# Patient Record
Sex: Male | Born: 1955 | Race: White | Hispanic: No | Marital: Married | Smoking: Never smoker
Health system: Southern US, Community
[De-identification: ages and names within clinical notes are randomized; demographics above are authoritative.]

## PROBLEM LIST (undated history)

## (undated) DIAGNOSIS — G629 Polyneuropathy, unspecified: Secondary | ICD-10-CM

## (undated) DIAGNOSIS — R931 Abnormal findings on diagnostic imaging of heart and coronary circulation: Secondary | ICD-10-CM

## (undated) DIAGNOSIS — I10 Essential (primary) hypertension: Secondary | ICD-10-CM

## (undated) DIAGNOSIS — N189 Chronic kidney disease, unspecified: Secondary | ICD-10-CM

## (undated) DIAGNOSIS — E1029 Type 1 diabetes mellitus with other diabetic kidney complication: Secondary | ICD-10-CM

## (undated) DIAGNOSIS — E1065 Type 1 diabetes mellitus with hyperglycemia: Secondary | ICD-10-CM

## (undated) DIAGNOSIS — I739 Peripheral vascular disease, unspecified: Secondary | ICD-10-CM

## (undated) DIAGNOSIS — I96 Gangrene, not elsewhere classified: Secondary | ICD-10-CM

## (undated) DIAGNOSIS — N529 Male erectile dysfunction, unspecified: Secondary | ICD-10-CM

## (undated) DIAGNOSIS — R011 Cardiac murmur, unspecified: Secondary | ICD-10-CM

## (undated) DIAGNOSIS — E785 Hyperlipidemia, unspecified: Secondary | ICD-10-CM

## (undated) HISTORY — DX: Type 1 diabetes mellitus with hyperglycemia: E10.65

## (undated) HISTORY — DX: Abnormal findings on diagnostic imaging of heart and coronary circulation: R93.1

## (undated) HISTORY — DX: Cardiac murmur, unspecified: R01.1

## (undated) HISTORY — DX: Essential (primary) hypertension: I10

## (undated) HISTORY — DX: Polyneuropathy, unspecified: G62.9

## (undated) HISTORY — DX: Type 1 diabetes mellitus with other diabetic kidney complication: E10.29

## (undated) HISTORY — DX: Hyperlipidemia, unspecified: E78.5

## (undated) HISTORY — DX: Gangrene, not elsewhere classified: I96

## (undated) HISTORY — DX: Chronic kidney disease, unspecified: N18.9

## (undated) HISTORY — DX: Male erectile dysfunction, unspecified: N52.9

## (undated) HISTORY — DX: Peripheral vascular disease, unspecified: I73.9

---

## 1988-04-02 HISTORY — PX: PENILE PROSTHESIS IMPLANT: SHX240

## 1999-04-03 HISTORY — PX: OTHER SURGICAL HISTORY: SHX169

## 1999-12-11 ENCOUNTER — Encounter: Admission: RE | Admit: 1999-12-11 | Discharge: 2000-03-10 | Payer: Self-pay | Admitting: *Deleted

## 2001-09-09 ENCOUNTER — Ambulatory Visit (HOSPITAL_COMMUNITY): Admission: RE | Admit: 2001-09-09 | Discharge: 2001-09-10 | Payer: Self-pay | Admitting: Ophthalmology

## 2001-09-09 ENCOUNTER — Encounter: Payer: Self-pay | Admitting: Ophthalmology

## 2007-04-03 HISTORY — PX: CATARACT EXTRACTION: SUR2

## 2010-06-22 ENCOUNTER — Other Ambulatory Visit: Payer: Self-pay | Admitting: *Deleted

## 2010-09-21 ENCOUNTER — Encounter (HOSPITAL_COMMUNITY)
Admission: RE | Admit: 2010-09-21 | Discharge: 2010-09-21 | Disposition: A | Discharge status: Home / Self Care | Payer: BC Managed Care – PPO | Source: Ambulatory Visit | Attending: Ophthalmology | Admitting: Ophthalmology

## 2010-09-21 ENCOUNTER — Ambulatory Visit (HOSPITAL_COMMUNITY)
Admission: RE | Admit: 2010-09-21 | Discharge: 2010-09-21 | Disposition: A | Discharge status: Home / Self Care | Payer: BC Managed Care – PPO | Source: Ambulatory Visit | Attending: Ophthalmology | Admitting: Ophthalmology

## 2010-09-21 ENCOUNTER — Other Ambulatory Visit (INDEPENDENT_AMBULATORY_CARE_PROVIDER_SITE_OTHER): Payer: Self-pay | Admitting: Ophthalmology

## 2010-09-21 DIAGNOSIS — H4312 Vitreous hemorrhage, left eye: Secondary | ICD-10-CM

## 2010-09-21 DIAGNOSIS — Z01818 Encounter for other preprocedural examination: Secondary | ICD-10-CM | POA: Insufficient documentation

## 2010-09-21 DIAGNOSIS — Z01812 Encounter for preprocedural laboratory examination: Secondary | ICD-10-CM | POA: Insufficient documentation

## 2010-09-21 DIAGNOSIS — Z01810 Encounter for preprocedural cardiovascular examination: Secondary | ICD-10-CM | POA: Insufficient documentation

## 2010-09-21 DIAGNOSIS — H4310 Vitreous hemorrhage, unspecified eye: Secondary | ICD-10-CM | POA: Insufficient documentation

## 2010-09-21 DIAGNOSIS — I10 Essential (primary) hypertension: Secondary | ICD-10-CM | POA: Insufficient documentation

## 2010-09-21 LAB — CBC
HCT: 41.4 % (ref 39.0–52.0)
Hemoglobin: 14.1 g/dL (ref 13.0–17.0)
MCH: 28.3 pg (ref 26.0–34.0)
MCHC: 34.1 g/dL (ref 30.0–36.0)
MCV: 83.1 fL (ref 78.0–100.0)
Platelets: 238 10*3/uL (ref 150–400)
RBC: 4.98 MIL/uL (ref 4.22–5.81)
RDW: 12.3 % (ref 11.5–15.5)
WBC: 6.8 10*3/uL (ref 4.0–10.5)

## 2010-09-21 LAB — BASIC METABOLIC PANEL
BUN: 36 mg/dL — ABNORMAL HIGH (ref 6–23)
CO2: 26 mEq/L (ref 19–32)
Calcium: 9.5 mg/dL (ref 8.4–10.5)
Chloride: 104 mEq/L (ref 96–112)
Creatinine, Ser: 1.69 mg/dL — ABNORMAL HIGH (ref 0.50–1.35)
GFR calc Af Amer: 51 mL/min — ABNORMAL LOW (ref >60)
GFR calc non Af Amer: 42 mL/min — ABNORMAL LOW (ref >60)
Glucose, Bld: 200 mg/dL — ABNORMAL HIGH (ref 70–99)
Potassium: 5.7 mEq/L — ABNORMAL HIGH (ref 3.5–5.1)
Sodium: 136 mEq/L (ref 135–145)

## 2010-09-21 LAB — SURGICAL PCR SCREEN
MRSA, PCR: NEGATIVE
Staphylococcus aureus: NEGATIVE

## 2010-09-26 ENCOUNTER — Ambulatory Visit (HOSPITAL_COMMUNITY)
Admission: RE | Admit: 2010-09-26 | Discharge: 2010-09-27 | Disposition: A | Discharge status: Home / Self Care | Payer: BC Managed Care – PPO | Source: Ambulatory Visit | Attending: Ophthalmology | Admitting: Ophthalmology

## 2010-09-26 DIAGNOSIS — Z01818 Encounter for other preprocedural examination: Secondary | ICD-10-CM | POA: Insufficient documentation

## 2010-09-26 DIAGNOSIS — Z01810 Encounter for preprocedural cardiovascular examination: Secondary | ICD-10-CM | POA: Insufficient documentation

## 2010-09-26 DIAGNOSIS — E11359 Type 2 diabetes mellitus with proliferative diabetic retinopathy without macular edema: Secondary | ICD-10-CM | POA: Insufficient documentation

## 2010-09-26 DIAGNOSIS — Z01812 Encounter for preprocedural laboratory examination: Secondary | ICD-10-CM | POA: Insufficient documentation

## 2010-09-26 DIAGNOSIS — I10 Essential (primary) hypertension: Secondary | ICD-10-CM | POA: Insufficient documentation

## 2010-09-26 DIAGNOSIS — H4310 Vitreous hemorrhage, unspecified eye: Secondary | ICD-10-CM | POA: Insufficient documentation

## 2010-09-26 DIAGNOSIS — E1139 Type 2 diabetes mellitus with other diabetic ophthalmic complication: Secondary | ICD-10-CM | POA: Insufficient documentation

## 2010-09-26 LAB — GLUCOSE, CAPILLARY
Glucose-Capillary: 123 mg/dL — ABNORMAL HIGH (ref 70–99)
Glucose-Capillary: 106 mg/dL — ABNORMAL HIGH (ref 70–99)
Glucose-Capillary: 113 mg/dL — ABNORMAL HIGH (ref 70–99)

## 2010-09-27 LAB — GLUCOSE, CAPILLARY: Glucose-Capillary: 232 mg/dL — ABNORMAL HIGH (ref 70–99)

## 2010-09-27 LAB — POCT I-STAT, CHEM 8
Creatinine, Ser: 1.7 mg/dL — ABNORMAL HIGH (ref 0.50–1.35)
Glucose, Bld: 183 mg/dL — ABNORMAL HIGH (ref 70–99)
Hemoglobin: 14.3 g/dL (ref 13.0–17.0)
Potassium: 4.7 mEq/L (ref 3.5–5.1)

## 2010-10-20 NOTE — Op Note (Signed)
  Wayne Hartman, Wayne Hartman NO.:  [id number]  MEDICAL RECORD NO.:  [id number]  LOCATION:  79                         FACILITY:  [facility]  PHYSICIAN:  Chrystie Nose. Zigmund Daniel, M.D. DATE OF BIRTH:  1955-06-25  DATE OF PROCEDURE:  09/26/2010 DATE OF DISCHARGE:                              OPERATIVE REPORT   ADMISSION DIAGNOSIS:  Vitreous hemorrhage in posterior vitreous membranes, proliferative diabetic retinopathy, left eye.  PROCEDURES:  Pars plana vitrectomy with 25-gauge system, retinal photocoagulation, membrane peel, gas-fluid exchange in the left eye.  SURGEON:  Chrystie Nose. Zigmund Daniel, MD  ASSISTANT:  Deatra Ina, MA  ANESTHESIA:  General.  DETAILS:  Usual prep and drape, 25 gauge trocar was placed at 10, 2, and 4 o'clock respectively.  Provisc was placed on the corneal surface. Pars plana vitrectomy was begun just behind the cataractous lens.  There was blood on the posterior surface of the lens, this was teased off of the lens with suction.  There was no trauma to the lens.  The vitrectomy was carried into the mid vitreous and into the core vitreous down to the retinal surface where posterior membranes were encountered.  These were removed with a vitreous cutter and the lighted pick.  The vitrectomy is carried out at the mid periphery where blood and vitreous was encountered.  The vitrectomy was then carried to the far periphery down to the vitreous base.  Scleral depression was used to gain access to this area and blood was carefully removed and trimmed from the vitreous base at 360 degrees.  Once this was accomplished, the endolaser was positioned in the eye, 737 burns were placed around the retinal periphery.  The power is 1000 milliwatts each, spot size 1000 microns each, and 0.1 seconds each.  A 30% gas fluid exchange was performed and the instruments were removed from the eye.  The trocars were removed from the eye.  The wounds were tested and found to be  tight.  Hemostasis of the conjunctiva with wet-field cautery was required.  The polymyxin and gentamicin were irrigated into [place] space.  Atropine solution was applied.  Marcaine was injected around the globe for postop pain, Decadron 10 mg was injected into the lower subconjunctival space. Polysporin ophthalmic ointment and patch and shield were placed. Closing pressure was 10 with a Pharmacologist.  COMPLICATIONS:  None.  DURATION:  45 minutes.     Chrystie Nose. Zigmund Daniel, M.D.     JDM/MEDQ  D:  09/26/2010  T:  09/27/2010  Job:  [id number]  Electronically Signed by Tempie Hoist M.D. on 10/20/2010 09:54:31 AM

## 2010-10-30 ENCOUNTER — Encounter (INDEPENDENT_AMBULATORY_CARE_PROVIDER_SITE_OTHER): Payer: BC Managed Care – PPO | Admitting: Ophthalmology

## 2010-10-30 DIAGNOSIS — E1065 Type 1 diabetes mellitus with hyperglycemia: Secondary | ICD-10-CM

## 2010-10-30 DIAGNOSIS — H2510 Age-related nuclear cataract, unspecified eye: Secondary | ICD-10-CM

## 2010-10-30 DIAGNOSIS — H4310 Vitreous hemorrhage, unspecified eye: Secondary | ICD-10-CM

## 2010-10-30 DIAGNOSIS — E1039 Type 1 diabetes mellitus with other diabetic ophthalmic complication: Secondary | ICD-10-CM

## 2010-10-30 DIAGNOSIS — E11359 Type 2 diabetes mellitus with proliferative diabetic retinopathy without macular edema: Secondary | ICD-10-CM

## 2011-01-02 ENCOUNTER — Encounter (INDEPENDENT_AMBULATORY_CARE_PROVIDER_SITE_OTHER): Payer: BC Managed Care – PPO | Admitting: Ophthalmology

## 2011-01-02 DIAGNOSIS — E11359 Type 2 diabetes mellitus with proliferative diabetic retinopathy without macular edema: Secondary | ICD-10-CM

## 2011-01-02 DIAGNOSIS — H2510 Age-related nuclear cataract, unspecified eye: Secondary | ICD-10-CM

## 2011-01-02 DIAGNOSIS — H4310 Vitreous hemorrhage, unspecified eye: Secondary | ICD-10-CM

## 2011-07-09 ENCOUNTER — Ambulatory Visit (INDEPENDENT_AMBULATORY_CARE_PROVIDER_SITE_OTHER): Payer: BC Managed Care – PPO | Admitting: Ophthalmology

## 2011-08-09 ENCOUNTER — Ambulatory Visit (INDEPENDENT_AMBULATORY_CARE_PROVIDER_SITE_OTHER): Payer: BC Managed Care – PPO | Admitting: Ophthalmology

## 2011-08-09 DIAGNOSIS — H2510 Age-related nuclear cataract, unspecified eye: Secondary | ICD-10-CM

## 2011-08-09 DIAGNOSIS — E11359 Type 2 diabetes mellitus with proliferative diabetic retinopathy without macular edema: Secondary | ICD-10-CM

## 2011-08-09 DIAGNOSIS — E1039 Type 1 diabetes mellitus with other diabetic ophthalmic complication: Secondary | ICD-10-CM

## 2012-02-27 ENCOUNTER — Ambulatory Visit (INDEPENDENT_AMBULATORY_CARE_PROVIDER_SITE_OTHER): Payer: BC Managed Care – PPO | Admitting: Ophthalmology

## 2012-03-11 ENCOUNTER — Ambulatory Visit (INDEPENDENT_AMBULATORY_CARE_PROVIDER_SITE_OTHER): Payer: BC Managed Care – PPO | Admitting: Ophthalmology

## 2012-03-11 DIAGNOSIS — H43819 Vitreous degeneration, unspecified eye: Secondary | ICD-10-CM

## 2012-03-11 DIAGNOSIS — E11359 Type 2 diabetes mellitus with proliferative diabetic retinopathy without macular edema: Secondary | ICD-10-CM

## 2012-03-11 DIAGNOSIS — I10 Essential (primary) hypertension: Secondary | ICD-10-CM

## 2012-03-11 DIAGNOSIS — H35039 Hypertensive retinopathy, unspecified eye: Secondary | ICD-10-CM

## 2012-03-11 DIAGNOSIS — E1039 Type 1 diabetes mellitus with other diabetic ophthalmic complication: Secondary | ICD-10-CM

## 2012-03-11 DIAGNOSIS — H2510 Age-related nuclear cataract, unspecified eye: Secondary | ICD-10-CM

## 2012-09-09 ENCOUNTER — Ambulatory Visit (INDEPENDENT_AMBULATORY_CARE_PROVIDER_SITE_OTHER): Payer: BC Managed Care – PPO | Admitting: Ophthalmology

## 2015-07-19 DIAGNOSIS — Z1389 Encounter for screening for other disorder: Secondary | ICD-10-CM | POA: Diagnosis not present

## 2015-07-19 DIAGNOSIS — E1049 Type 1 diabetes mellitus with other diabetic neurological complication: Secondary | ICD-10-CM | POA: Diagnosis not present

## 2015-07-19 DIAGNOSIS — E1065 Type 1 diabetes mellitus with hyperglycemia: Secondary | ICD-10-CM | POA: Diagnosis not present

## 2015-07-19 DIAGNOSIS — I10 Essential (primary) hypertension: Secondary | ICD-10-CM | POA: Diagnosis not present

## 2015-07-28 DIAGNOSIS — E109 Type 1 diabetes mellitus without complications: Secondary | ICD-10-CM | POA: Diagnosis not present

## 2015-08-01 DIAGNOSIS — E109 Type 1 diabetes mellitus without complications: Secondary | ICD-10-CM | POA: Diagnosis not present

## 2015-08-02 DIAGNOSIS — E113511 Type 2 diabetes mellitus with proliferative diabetic retinopathy with macular edema, right eye: Secondary | ICD-10-CM | POA: Diagnosis not present

## 2015-08-02 DIAGNOSIS — H25812 Combined forms of age-related cataract, left eye: Secondary | ICD-10-CM | POA: Diagnosis not present

## 2015-08-25 DIAGNOSIS — E113511 Type 2 diabetes mellitus with proliferative diabetic retinopathy with macular edema, right eye: Secondary | ICD-10-CM | POA: Diagnosis not present

## 2015-09-29 DIAGNOSIS — E113511 Type 2 diabetes mellitus with proliferative diabetic retinopathy with macular edema, right eye: Secondary | ICD-10-CM | POA: Diagnosis not present

## 2015-10-20 DIAGNOSIS — H2512 Age-related nuclear cataract, left eye: Secondary | ICD-10-CM | POA: Diagnosis not present

## 2015-10-20 DIAGNOSIS — H25812 Combined forms of age-related cataract, left eye: Secondary | ICD-10-CM | POA: Diagnosis not present

## 2015-10-21 DIAGNOSIS — H25812 Combined forms of age-related cataract, left eye: Secondary | ICD-10-CM | POA: Diagnosis not present

## 2015-10-24 DIAGNOSIS — E119 Type 2 diabetes mellitus without complications: Secondary | ICD-10-CM | POA: Diagnosis not present

## 2015-10-27 DIAGNOSIS — E113511 Type 2 diabetes mellitus with proliferative diabetic retinopathy with macular edema, right eye: Secondary | ICD-10-CM | POA: Diagnosis not present

## 2015-11-02 DIAGNOSIS — E109 Type 1 diabetes mellitus without complications: Secondary | ICD-10-CM | POA: Diagnosis not present

## 2015-11-02 DIAGNOSIS — E1040 Type 1 diabetes mellitus with diabetic neuropathy, unspecified: Secondary | ICD-10-CM | POA: Diagnosis not present

## 2015-11-02 DIAGNOSIS — E1065 Type 1 diabetes mellitus with hyperglycemia: Secondary | ICD-10-CM | POA: Diagnosis not present

## 2015-11-17 DIAGNOSIS — I10 Essential (primary) hypertension: Secondary | ICD-10-CM | POA: Diagnosis not present

## 2015-11-25 DIAGNOSIS — H40022 Open angle with borderline findings, high risk, left eye: Secondary | ICD-10-CM | POA: Diagnosis not present

## 2016-02-27 DIAGNOSIS — E119 Type 2 diabetes mellitus without complications: Secondary | ICD-10-CM | POA: Diagnosis not present

## 2016-03-01 DIAGNOSIS — E1065 Type 1 diabetes mellitus with hyperglycemia: Secondary | ICD-10-CM | POA: Diagnosis not present

## 2016-03-01 DIAGNOSIS — E1049 Type 1 diabetes mellitus with other diabetic neurological complication: Secondary | ICD-10-CM | POA: Diagnosis not present

## 2016-08-20 DIAGNOSIS — H4051X2 Glaucoma secondary to other eye disorders, right eye, moderate stage: Secondary | ICD-10-CM | POA: Diagnosis not present

## 2016-12-19 DIAGNOSIS — H401132 Primary open-angle glaucoma, bilateral, moderate stage: Secondary | ICD-10-CM | POA: Diagnosis not present

## 2017-01-18 DIAGNOSIS — E1049 Type 1 diabetes mellitus with other diabetic neurological complication: Secondary | ICD-10-CM | POA: Diagnosis not present

## 2017-01-23 DIAGNOSIS — E1065 Type 1 diabetes mellitus with hyperglycemia: Secondary | ICD-10-CM | POA: Diagnosis not present

## 2017-01-23 DIAGNOSIS — E10311 Type 1 diabetes mellitus with unspecified diabetic retinopathy with macular edema: Secondary | ICD-10-CM | POA: Diagnosis not present

## 2017-01-23 DIAGNOSIS — E1049 Type 1 diabetes mellitus with other diabetic neurological complication: Secondary | ICD-10-CM | POA: Diagnosis not present

## 2017-01-23 DIAGNOSIS — E1029 Type 1 diabetes mellitus with other diabetic kidney complication: Secondary | ICD-10-CM | POA: Diagnosis not present

## 2017-03-06 DIAGNOSIS — I10 Essential (primary) hypertension: Secondary | ICD-10-CM | POA: Diagnosis not present

## 2017-03-06 DIAGNOSIS — E10311 Type 1 diabetes mellitus with unspecified diabetic retinopathy with macular edema: Secondary | ICD-10-CM | POA: Diagnosis not present

## 2017-03-06 DIAGNOSIS — Z6827 Body mass index (BMI) 27.0-27.9, adult: Secondary | ICD-10-CM | POA: Diagnosis not present

## 2017-03-06 DIAGNOSIS — E782 Mixed hyperlipidemia: Secondary | ICD-10-CM | POA: Diagnosis not present

## 2017-07-23 DIAGNOSIS — S90934A Unspecified superficial injury of right lesser toe(s), initial encounter: Secondary | ICD-10-CM | POA: Diagnosis not present

## 2017-07-24 DIAGNOSIS — R011 Cardiac murmur, unspecified: Secondary | ICD-10-CM | POA: Diagnosis not present

## 2017-07-24 DIAGNOSIS — M79676 Pain in unspecified toe(s): Secondary | ICD-10-CM | POA: Diagnosis not present

## 2017-07-24 DIAGNOSIS — I96 Gangrene, not elsewhere classified: Secondary | ICD-10-CM | POA: Diagnosis not present

## 2017-07-30 DIAGNOSIS — Z872 Personal history of diseases of the skin and subcutaneous tissue: Secondary | ICD-10-CM | POA: Diagnosis not present

## 2017-07-30 DIAGNOSIS — I96 Gangrene, not elsewhere classified: Secondary | ICD-10-CM | POA: Diagnosis not present

## 2017-08-01 DIAGNOSIS — I739 Peripheral vascular disease, unspecified: Secondary | ICD-10-CM | POA: Diagnosis not present

## 2017-08-01 DIAGNOSIS — R011 Cardiac murmur, unspecified: Secondary | ICD-10-CM | POA: Diagnosis not present

## 2017-08-02 ENCOUNTER — Ambulatory Visit: Payer: BLUE CROSS/BLUE SHIELD | Admitting: Cardiology

## 2017-08-02 VITALS — BP 176/98 | HR 79 | Ht 71.0 in | Wt 205.0 lb

## 2017-08-02 DIAGNOSIS — I739 Peripheral vascular disease, unspecified: Secondary | ICD-10-CM

## 2017-08-02 DIAGNOSIS — I96 Gangrene, not elsewhere classified: Secondary | ICD-10-CM | POA: Insufficient documentation

## 2017-08-02 DIAGNOSIS — G629 Polyneuropathy, unspecified: Secondary | ICD-10-CM

## 2017-08-02 DIAGNOSIS — IMO0002 Reserved for concepts with insufficient information to code with codable children: Secondary | ICD-10-CM

## 2017-08-02 DIAGNOSIS — E785 Hyperlipidemia, unspecified: Secondary | ICD-10-CM | POA: Insufficient documentation

## 2017-08-02 DIAGNOSIS — I10 Essential (primary) hypertension: Secondary | ICD-10-CM

## 2017-08-02 DIAGNOSIS — R931 Abnormal findings on diagnostic imaging of heart and coronary circulation: Secondary | ICD-10-CM

## 2017-08-02 DIAGNOSIS — E1029 Type 1 diabetes mellitus with other diabetic kidney complication: Secondary | ICD-10-CM

## 2017-08-02 DIAGNOSIS — R011 Cardiac murmur, unspecified: Secondary | ICD-10-CM

## 2017-08-02 DIAGNOSIS — N189 Chronic kidney disease, unspecified: Secondary | ICD-10-CM

## 2017-08-02 DIAGNOSIS — E1065 Type 1 diabetes mellitus with hyperglycemia: Secondary | ICD-10-CM

## 2017-08-02 HISTORY — DX: Hyperlipidemia, unspecified: E78.5

## 2017-08-02 HISTORY — DX: Essential (primary) hypertension: I10

## 2017-08-02 HISTORY — DX: Gangrene, not elsewhere classified: I96

## 2017-08-02 HISTORY — DX: Abnormal findings on diagnostic imaging of heart and coronary circulation: R93.1

## 2017-08-02 HISTORY — DX: Chronic kidney disease, unspecified: N18.9

## 2017-08-02 HISTORY — DX: Cardiac murmur, unspecified: R01.1

## 2017-08-02 HISTORY — DX: Type 1 diabetes mellitus with other diabetic kidney complication: E10.29

## 2017-08-02 HISTORY — DX: Reserved for concepts with insufficient information to code with codable children: IMO0002

## 2017-08-02 HISTORY — DX: Peripheral vascular disease, unspecified: I73.9

## 2017-08-02 HISTORY — DX: Polyneuropathy, unspecified: G62.9

## 2017-08-02 NOTE — H&P (View-Only) (Signed)
Cardiology Office Note:    Date:  08/02/2017   ID:  Wayne Hartman, DOB 1955-04-03, MRN [id number]  PCP:  Myer Peer, MD  Cardiologist:  Shirlee More, MD   Referring MD: Myer Peer, MD  ASSESSMENT:    1. Heart murmur   2. Abnormal echocardiogram   3. PAD (peripheral artery disease) ([place])   4. Chronic kidney disease, unspecified CKD stage    PLAN:    In order of problems listed above:  1. He has abnormal echocardiogram transesophageal echo ordered. 2. Echo findings are not diagnostic of endocarditis by itself and although he is received oral antibiotics to be unlikely not to have fever or chills and a normal white count.  He has been off antibiotics for 48 hours we will culture his blood today do a CBC check a sedimentation rate and check a comprehensive metabolic panel with his abnormal renal function.  At this time I prefer to keep him off antibiotics of his echocardiogram is diagnostic he would benefit from infectious disease consult as I suspect that his cultures will be negative because of preceding oral antibiotics. 3. He has mild symptoms of claudication walking uphill I would delay   any further vascular evaluation until the issue of endocarditis has been resolved 4. Recheck renal function today  Next appointment   Medication Adjustments/Labs and Tests Ordered: Current medicines are reviewed at length with the patient today.  Concerns regarding medicines are outlined above.  No orders of the defined types were placed in this encounter.  No orders of the defined types were placed in this encounter.    Chief Complaint  Patient presents with  . Heart Murmur    abnormal echo is concerning for endocarditis    History of Present Illness:    Wayne Hartman is a 62 y.o. male who is being seen today for the evaluation of endocarditis at the request of Myer Peer, MD. Dr. Nicki Reaper phoned me last evening for the patient be worked into our office.  He received a  call that he is an abnormal echocardiogram and  endocarditis.  He has had an embolic episode to a toe and has been seen by surgery and a question of osteomyelitis on x-ray.  He had a new heart murmur appreciated and recently has had oral surgery.  The linical concern is endocarditis,  he has been on oral antibiotics and has not had blood cultures performed.  I reviewed the echocardiogram the report is not definitive and best described as small mobile vegetation suggest clinical correlation and/or TEE and  the degree of valvular regurgitation was mild.  He was seen approximately 2 weeks ago at urgent care because of pain and discoloration of the second toe right foot.  Subsequently is called but there were findings of osteomyelitis and he was started on Augmentin last dose 07/31/2017.  During this time he said no fever or chills no weight loss and does not feel particularly ill.  He says that the toe is improved.  Follow-up with Dr. Nicki Reaper is noted to have a systolic ejection murmur echocardiogram was performed.  He has no underlying history of heart disease.  He did have oral surgery with a tooth extraction and bone graft about 6 weeks ago he had no fever chills afterwards.  He relates that lab work was performed initially that his CBC was good but recent labs noted by surgery yesterday is that he has stage III CKD.  He is having no  neurologic symptoms chest pain shortness of breath or palpitation.  Past Medical History:  Diagnosis Date  . Abnormal echocardiogram 08/02/2017  . CKD (chronic kidney disease) 08/02/2017  . Erectile dysfunction   . Heart murmur 08/02/2017  . Hyperlipidemia 08/02/2017  . Hypertension 08/02/2017  . Necrotic toes ([place]) 08/02/2017  . PAD (peripheral artery disease) ([place]) 08/02/2017  . Peripheral neuropathy 08/02/2017  . Type 1 diabetes, uncontrolled, with renal manifestation ([place]) 08/02/2017    Past Surgical History:  Procedure Laterality Date  . CATARACT EXTRACTION Right 2009   [facility]igmund [facility] at [facility]  . PENILE PROSTHESIS IMPLANT  1990  . vitrous fluid replacement Right 2001    Current Medications: Current Meds  Medication Sig  . insulin glargine (LANTUS) 100 UNIT/ML injection Inject 24 Units into the skin daily.     Allergies:   Pramlintide   Social History   Socioeconomic History  . Marital status: Married    Spouse name: Not on file  . Number of children: Not on file  . Years of education: Not on file  . Highest education level: Not on file  Occupational History  . Not on file  Social Needs  . Financial resource strain: Not on file  . Food insecurity:    Worry: Not on file    Inability: Not on file  . Transportation needs:    Medical: Not on file    Non-medical: Not on file  Tobacco Use  . Smoking status: Never Smoker  . Smokeless tobacco: Never Used  Substance and Sexual Activity  . Alcohol use: Not Currently  . Drug use: Not Currently  . Sexual activity: Not on file  Lifestyle  . Physical activity:    Days per week: Not on file    Minutes per session: Not on file  . Stress: Not on file  Relationships  . Social connections:    Talks on phone: Not on file    Gets together: Not on file    Attends religious service: Not on file    Active member of club or organization: Not on file    Attends meetings of clubs or organizations: Not on file    Relationship status: Not on file  Other Topics Concern  . Not on file  Social History Narrative  . Not on file     Family History: The patient's family history includes Celiac disease in his mother and sister; Diabetes in his mother and sister; Other in his father.  ROS:   ROS Please see the history of present illness.     All other systems reviewed and are negative.  EKGs/Labs/Other Studies Reviewed:    The following studies were reviewed today:   EKG:  EKG is  ordered today.  The ekg ordered today demonstrates normal  ABI's non calculable with non compressive vessels  TTE  cannot exclude vegeatation mitral valve, trace to mild MR  Xray of foot " suspicious for osteomyelitis"  Recent Labs: No results found for requested labs within last 8760 hours.  Recent Lipid Panel No results found for: CHOL, TRIG, HDL, CHOLHDL, VLDL, LDLCALC, LDLDIRECT  Physical Exam:    VS:  BP (!) 176/98 (BP Location: Left Arm, Patient Position: Sitting, Cuff Size: Normal)   Ht 5\' 11"  (1.803 m)   Wt 205 lb (93 kg)   BMI 28.59 kg/m     Wt Readings from Last 3 Encounters:  08/02/17 205 lb (93 kg)     GEN: He does not appear ill  he has no skin or nail findings of endocarditis well nourished, well developed in no acute distress HEENT: Normal NECK: No JVD; No carotid bruits LYMPHATICS: No lymphadenopathy CARDIAC: Grade 1/6 localized ejection murmur aortic area no aortic regurgitation he also has systolic ejection clicks at the left lower sternal border RRR, no, rubs, gallops RESPIRATORY:  Clear to auscultation without rales, wheezing or rhonchi  ABDOMEN: Soft, non-tender, non-distended MUSCULOSKELETAL:  No edema; No deformity  SKIN: Warm and dry NEUROLOGIC:  Alert and oriented x 3 PSYCHIATRIC:  Normal affect     Signed, Shirlee More, MD  08/02/2017 11:46 AM    [place]

## 2017-08-02 NOTE — Patient Instructions (Signed)
Medication Instructions:  None  Labwork: Your physician recommends that you have the following labs drawn: Blood culture, CBC, CMP, sedimentation rate  Testing/Procedures: You had an EKG today.  Your physician has requested that you have a TEE. During a TEE, sound waves are used to create images of your heart. It provides your doctor with information about the size and shape of your heart and how well your heart's chambers and valves are working. In this test, a transducer is attached to the end of a flexible tube that's guided down your throat and into your esophagus (the tube leading from you mouth to your stomach) to get a more detailed image of your heart. You are not awake for the procedure. Please see the instruction sheet given to you today. For further information please visit [email or web address].  Follow-Up: Pending TEE results.  Any Other Special Instructions Will Be Listed Below (If Applicable).     If you need a refill on your cardiac medications before your next appointment, please call your pharmacy.

## 2017-08-02 NOTE — Progress Notes (Signed)
Cardiology Office Note:    Date:  08/02/2017   ID:  Wayne Hartman, DOB 1955/09/14, MRN [id number]  PCP:  Wayne Peer, MD  Cardiologist:  Wayne More, MD   Referring MD: Wayne Peer, MD  ASSESSMENT:    1. Heart murmur   2. Abnormal echocardiogram   3. PAD (peripheral artery disease) ([place])   4. Chronic kidney disease, unspecified CKD stage    PLAN:    In order of problems listed above:  1. He has abnormal echocardiogram transesophageal echo ordered. 2. Echo findings are not diagnostic of endocarditis by itself and although he is received oral antibiotics to be unlikely not to have fever or chills and a normal white count.  He has been off antibiotics for 48 hours we will culture his blood today do a CBC check a sedimentation rate and check a comprehensive metabolic panel with his abnormal renal function.  At this time I prefer to keep him off antibiotics of his echocardiogram is diagnostic he would benefit from infectious disease consult as I suspect that his cultures will be negative because of preceding oral antibiotics. 3. He has mild symptoms of claudication walking uphill I would delay   any further vascular evaluation until the issue of endocarditis has been resolved 4. Recheck renal function today  Next appointment   Medication Adjustments/Labs and Tests Ordered: Current medicines are reviewed at length with the patient today.  Concerns regarding medicines are outlined above.  No orders of the defined types were placed in this encounter.  No orders of the defined types were placed in this encounter.    Chief Complaint  Patient presents with  . Heart Murmur    abnormal echo is concerning for endocarditis    History of Present Illness:    Wayne Hartman is a 62 y.o. male who is being seen today for the evaluation of endocarditis at the request of Wayne Peer, MD. Dr. Nicki Hartman phoned me last evening for the patient be worked into our office.  He received a  call that he is an abnormal echocardiogram and  endocarditis.  He has had an embolic episode to a toe and has been seen by surgery and a question of osteomyelitis on x-ray.  He had a new heart murmur appreciated and recently has had oral surgery.  The linical concern is endocarditis,  he has been on oral antibiotics and has not had blood cultures performed.  I reviewed the echocardiogram the report is not definitive and best described as small mobile vegetation suggest clinical correlation and/or TEE and  the degree of valvular regurgitation was mild.  He was seen approximately 2 weeks ago at urgent care because of pain and discoloration of the second toe right foot.  Subsequently is called but there were findings of osteomyelitis and he was started on Augmentin last dose 07/31/2017.  During this time he said no fever or chills no weight loss and does not feel particularly ill.  He says that the toe is improved.  Follow-up with Dr. Nicki Hartman is noted to have a systolic ejection murmur echocardiogram was performed.  He has no underlying history of heart disease.  He did have oral surgery with a tooth extraction and bone graft about 6 weeks ago he had no fever chills afterwards.  He relates that lab work was performed initially that his CBC was good but recent labs noted by surgery yesterday is that he has stage III CKD.  He is having no  neurologic symptoms chest pain shortness of breath or palpitation.  Past Medical History:  Diagnosis Date  . Abnormal echocardiogram 08/02/2017  . CKD (chronic kidney disease) 08/02/2017  . Erectile dysfunction   . Heart murmur 08/02/2017  . Hyperlipidemia 08/02/2017  . Hypertension 08/02/2017  . Necrotic toes ([place]) 08/02/2017  . PAD (peripheral artery disease) ([place]) 08/02/2017  . Peripheral neuropathy 08/02/2017  . Type 1 diabetes, uncontrolled, with renal manifestation ([place]) 08/02/2017    Past Surgical History:  Procedure Laterality Date  . CATARACT EXTRACTION Right 2009   [facility]und [facility]man at [facility]  . PENILE PROSTHESIS IMPLANT  1990  . vitrous fluid replacement Right 2001    Current Medications: Current Meds  Medication Sig  . insulin glargine (LANTUS) 100 UNIT/ML injection Inject 24 Units into the skin daily.     Allergies:   Pramlintide   Social History   Socioeconomic History  . Marital status: Married    Spouse name: Not on file  . Number of children: Not on file  . Years of education: Not on file  . Highest education level: Not on file  Occupational History  . Not on file  Social Needs  . Financial resource strain: Not on file  . Food insecurity:    Worry: Not on file    Inability: Not on file  . Transportation needs:    Medical: Not on file    Non-medical: Not on file  Tobacco Use  . Smoking status: Never Smoker  . Smokeless tobacco: Never Used  Substance and Sexual Activity  . Alcohol use: Not Currently  . Drug use: Not Currently  . Sexual activity: Not on file  Lifestyle  . Physical activity:    Days per week: Not on file    Minutes per session: Not on file  . Stress: Not on file  Relationships  . Social connections:    Talks on phone: Not on file    Gets together: Not on file    Attends religious service: Not on file    Active member of club or organization: Not on file    Attends meetings of clubs or organizations: Not on file    Relationship status: Not on file  Other Topics Concern  . Not on file  Social History Narrative  . Not on file     Family History: The patient's family history includes Celiac disease in his mother and sister; Diabetes in his mother and sister; Other in his father.  ROS:   ROS Please see the history of present illness.     All other systems reviewed and are negative.  EKGs/Labs/Other Studies Reviewed:    The following studies were reviewed today:   EKG:  EKG is  ordered today.  The ekg ordered today demonstrates normal  ABI's non calculable with non compressive vessels  TTE  cannot exclude vegeatation mitral valve, trace to mild MR  Xray of foot " suspicious for osteomyelitis"  Recent Labs: No results found for requested labs within last 8760 hours.  Recent Lipid Panel No results found for: CHOL, TRIG, HDL, CHOLHDL, VLDL, LDLCALC, LDLDIRECT  Physical Exam:    VS:  BP (!) 176/98 (BP Location: Left Arm, Patient Position: Sitting, Cuff Size: Normal)   Ht 5\' 11"  (1.803 m)   Wt 205 lb (93 kg)   BMI 28.59 kg/m     Wt Readings from Last 3 Encounters:  08/02/17 205 lb (93 kg)     GEN: He does not appear ill  he has no skin or nail findings of endocarditis well nourished, well developed in no acute distress HEENT: Normal NECK: No JVD; No carotid bruits LYMPHATICS: No lymphadenopathy CARDIAC: Grade 1/6 localized ejection murmur aortic area no aortic regurgitation he also has systolic ejection clicks at the left lower sternal border RRR, no, rubs, gallops RESPIRATORY:  Clear to auscultation without rales, wheezing or rhonchi  ABDOMEN: Soft, non-tender, non-distended MUSCULOSKELETAL:  No edema; No deformity  SKIN: Warm and dry NEUROLOGIC:  Alert and oriented x 3 PSYCHIATRIC:  Normal affect     Signed, Wayne More, MD  08/02/2017 11:46 AM    [place]

## 2017-08-03 LAB — COMPREHENSIVE METABOLIC PANEL
ALT: 21 IU/L (ref 0–44)
AST: 25 IU/L (ref 0–40)
Albumin/Globulin Ratio: 2 (ref 1.2–2.2)
Albumin: 4.4 g/dL (ref 3.6–4.8)
Alkaline Phosphatase: 79 IU/L (ref 39–117)
BUN/Creatinine Ratio: 19 (ref 10–24)
BUN: 39 mg/dL — AB (ref 8–27)
Bilirubin Total: 0.4 mg/dL (ref 0.0–1.2)
CALCIUM: 9.4 mg/dL (ref 8.6–10.2)
CO2: 21 mmol/L (ref 20–29)
CREATININE: 2.08 mg/dL — AB (ref 0.76–1.27)
Chloride: 104 mmol/L (ref 96–106)
GFR calc Af Amer: 38 mL/min/{1.73_m2} — ABNORMAL LOW (ref >59)
GFR, EST NON AFRICAN AMERICAN: 33 mL/min/{1.73_m2} — AB (ref >59)
GLOBULIN, TOTAL: 2.2 g/dL (ref 1.5–4.5)
Glucose: 159 mg/dL — ABNORMAL HIGH (ref 65–99)
Potassium: 5.3 mmol/L — ABNORMAL HIGH (ref 3.5–5.2)
Sodium: 140 mmol/L (ref 134–144)
Total Protein: 6.6 g/dL (ref 6.0–8.5)

## 2017-08-03 LAB — CBC
Hematocrit: 45.2 % (ref 37.5–51.0)
Hemoglobin: 14.9 g/dL (ref 13.0–17.7)
MCH: 28.7 pg (ref 26.6–33.0)
MCHC: 33 g/dL (ref 31.5–35.7)
MCV: 87 fL (ref 79–97)
Platelets: 242 10*3/uL (ref 150–379)
RBC: 5.2 x10E6/uL (ref 4.14–5.80)
RDW: 13.4 % (ref 12.3–15.4)
WBC: 6.1 10*3/uL (ref 3.4–10.8)

## 2017-08-03 LAB — SEDIMENTATION RATE: Sed Rate: 4 mm/hr (ref 0–30)

## 2017-08-06 ENCOUNTER — Encounter (HOSPITAL_COMMUNITY): Payer: Self-pay

## 2017-08-06 ENCOUNTER — Ambulatory Visit (HOSPITAL_BASED_OUTPATIENT_CLINIC_OR_DEPARTMENT_OTHER): Payer: BLUE CROSS/BLUE SHIELD

## 2017-08-06 ENCOUNTER — Encounter (HOSPITAL_COMMUNITY)
Admission: RE | Disposition: A | Discharge status: Home / Self Care | Payer: Self-pay | Source: Ambulatory Visit | Attending: Cardiovascular Disease

## 2017-08-06 ENCOUNTER — Other Ambulatory Visit: Payer: Self-pay

## 2017-08-06 ENCOUNTER — Ambulatory Visit (HOSPITAL_COMMUNITY)
Admission: RE | Admit: 2017-08-06 | Discharge: 2017-08-06 | Disposition: A | Discharge status: Home / Self Care | Payer: BLUE CROSS/BLUE SHIELD | Source: Ambulatory Visit | Attending: Cardiovascular Disease | Admitting: Cardiovascular Disease

## 2017-08-06 ENCOUNTER — Encounter: Payer: Self-pay | Admitting: *Deleted

## 2017-08-06 DIAGNOSIS — N189 Chronic kidney disease, unspecified: Secondary | ICD-10-CM | POA: Insufficient documentation

## 2017-08-06 DIAGNOSIS — R011 Cardiac murmur, unspecified: Secondary | ICD-10-CM

## 2017-08-06 DIAGNOSIS — I129 Hypertensive chronic kidney disease with stage 1 through stage 4 chronic kidney disease, or unspecified chronic kidney disease: Secondary | ICD-10-CM | POA: Insufficient documentation

## 2017-08-06 DIAGNOSIS — Z794 Long term (current) use of insulin: Secondary | ICD-10-CM | POA: Diagnosis not present

## 2017-08-06 DIAGNOSIS — E1051 Type 1 diabetes mellitus with diabetic peripheral angiopathy without gangrene: Secondary | ICD-10-CM | POA: Diagnosis not present

## 2017-08-06 DIAGNOSIS — R931 Abnormal findings on diagnostic imaging of heart and coronary circulation: Secondary | ICD-10-CM | POA: Diagnosis not present

## 2017-08-06 DIAGNOSIS — E785 Hyperlipidemia, unspecified: Secondary | ICD-10-CM | POA: Diagnosis not present

## 2017-08-06 DIAGNOSIS — R9439 Abnormal result of other cardiovascular function study: Secondary | ICD-10-CM

## 2017-08-06 DIAGNOSIS — E1142 Type 2 diabetes mellitus with diabetic polyneuropathy: Secondary | ICD-10-CM | POA: Insufficient documentation

## 2017-08-06 DIAGNOSIS — E1022 Type 1 diabetes mellitus with diabetic chronic kidney disease: Secondary | ICD-10-CM | POA: Insufficient documentation

## 2017-08-06 HISTORY — PX: TEE WITHOUT CARDIOVERSION: SHX5443

## 2017-08-06 LAB — GLUCOSE, CAPILLARY: Glucose-Capillary: 195 mg/dL — ABNORMAL HIGH (ref 65–99)

## 2017-08-06 SURGERY — ECHOCARDIOGRAM, TRANSESOPHAGEAL
Anesthesia: Moderate Sedation

## 2017-08-06 MED ORDER — FENTANYL CITRATE (PF) 100 MCG/2ML IJ SOLN
INTRAMUSCULAR | Status: AC
  Filled 2017-08-06: qty 2

## 2017-08-06 MED ORDER — BUTAMBEN-TETRACAINE-BENZOCAINE 2-2-14 % EX AERO
INHALATION_SPRAY | CUTANEOUS | Status: DC | PRN
  Administered 2017-08-06: 2 via TOPICAL

## 2017-08-06 MED ORDER — FENTANYL CITRATE (PF) 100 MCG/2ML IJ SOLN
INTRAMUSCULAR | Status: DC | PRN
  Administered 2017-08-06 (×2): 25 ug via INTRAVENOUS

## 2017-08-06 MED ORDER — DIPHENHYDRAMINE HCL 50 MG/ML IJ SOLN
INTRAMUSCULAR | Status: DC | PRN
  Administered 2017-08-06: 50 mg via INTRAVENOUS

## 2017-08-06 MED ORDER — DIPHENHYDRAMINE HCL 50 MG/ML IJ SOLN
INTRAMUSCULAR | Status: AC
  Filled 2017-08-06: qty 1

## 2017-08-06 MED ORDER — MIDAZOLAM HCL 10 MG/2ML IJ SOLN
INTRAMUSCULAR | Status: DC | PRN
  Administered 2017-08-06 (×3): 2 mg via INTRAVENOUS

## 2017-08-06 MED ORDER — MIDAZOLAM HCL 5 MG/ML IJ SOLN
INTRAMUSCULAR | Status: AC
  Filled 2017-08-06: qty 2

## 2017-08-06 MED ORDER — SODIUM CHLORIDE 0.9 % IV SOLN
INTRAVENOUS | Status: DC
  Administered 2017-08-06: 09:00:00 via INTRAVENOUS

## 2017-08-06 NOTE — CV Procedure (Signed)
During this procedure the patient is administered a total of Versed 6 mg and Fentanyl 50 mg and Bendedryl 50 mg  to achieve and maintain moderate conscious sedation.  The patient's heart rate, blood pressure, and oxygen saturation are monitored continuously during the procedure. The period of conscious sedation is 30 minutes, of which I was present face-to-face 100% of this time.  Trivial MR normal MV Normal AV, TV, PV EF 60% No LAA thrombus Negative bubble study Normal RV Normal aorta  No SBE/vegetations  [name]

## 2017-08-06 NOTE — Discharge Instructions (Signed)

## 2017-08-06 NOTE — Interval H&P Note (Signed)
History and Physical Interval Note:  08/06/2017 9:40 AM  Wayne Hartman  has presented today for surgery, with the diagnosis of abnormal echo  The various methods of treatment have been discussed with the patient and family. After consideration of risks, benefits and other options for treatment, the patient has consented to  Procedure(s): TRANSESOPHAGEAL ECHOCARDIOGRAM (TEE) (N/A) as a surgical intervention .  The patient's history has been reviewed, patient examined, no change in status, stable for surgery.  I have reviewed the patient's chart and labs.  Questions were answered to the patient's satisfaction.     [name]

## 2017-08-08 LAB — CULTURE, BLOOD (SINGLE)

## 2017-08-09 ENCOUNTER — Encounter (HOSPITAL_COMMUNITY): Payer: Self-pay | Admitting: Cardiovascular Disease

## 2017-08-13 ENCOUNTER — Telehealth: Payer: Self-pay

## 2017-08-13 NOTE — Telephone Encounter (Signed)
Left message to return call to schedule follow-up appointment. Advised no rush on appointment. Just to get it scheduled.

## 2017-08-14 DIAGNOSIS — I739 Peripheral vascular disease, unspecified: Secondary | ICD-10-CM | POA: Diagnosis not present

## 2017-10-16 DIAGNOSIS — Z6826 Body mass index (BMI) 26.0-26.9, adult: Secondary | ICD-10-CM | POA: Diagnosis not present

## 2017-10-16 DIAGNOSIS — I739 Peripheral vascular disease, unspecified: Secondary | ICD-10-CM | POA: Diagnosis not present

## 2017-10-17 DIAGNOSIS — I70322 Atherosclerosis of unspecified type of bypass graft(s) of the extremities with rest pain, left leg: Secondary | ICD-10-CM | POA: Diagnosis not present

## 2017-10-17 DIAGNOSIS — I739 Peripheral vascular disease, unspecified: Secondary | ICD-10-CM | POA: Diagnosis not present

## 2017-10-17 DIAGNOSIS — I998 Other disorder of circulatory system: Secondary | ICD-10-CM | POA: Diagnosis not present

## 2017-10-18 DIAGNOSIS — E10319 Type 1 diabetes mellitus with unspecified diabetic retinopathy without macular edema: Secondary | ICD-10-CM | POA: Diagnosis not present

## 2017-10-18 DIAGNOSIS — I739 Peripheral vascular disease, unspecified: Secondary | ICD-10-CM | POA: Diagnosis not present

## 2017-10-18 DIAGNOSIS — N189 Chronic kidney disease, unspecified: Secondary | ICD-10-CM | POA: Diagnosis not present

## 2017-10-18 DIAGNOSIS — R202 Paresthesia of skin: Secondary | ICD-10-CM | POA: Diagnosis not present

## 2017-10-18 DIAGNOSIS — T79A22A Traumatic compartment syndrome of left lower extremity, initial encounter: Secondary | ICD-10-CM | POA: Diagnosis not present

## 2017-10-18 DIAGNOSIS — Z89512 Acquired absence of left leg below knee: Secondary | ICD-10-CM | POA: Diagnosis not present

## 2017-10-18 DIAGNOSIS — I998 Other disorder of circulatory system: Secondary | ICD-10-CM | POA: Diagnosis not present

## 2017-10-18 DIAGNOSIS — E1051 Type 1 diabetes mellitus with diabetic peripheral angiopathy without gangrene: Secondary | ICD-10-CM | POA: Diagnosis not present

## 2017-10-18 DIAGNOSIS — N183 Chronic kidney disease, stage 3 (moderate): Secondary | ICD-10-CM | POA: Diagnosis not present

## 2017-10-18 DIAGNOSIS — E1029 Type 1 diabetes mellitus with other diabetic kidney complication: Secondary | ICD-10-CM | POA: Diagnosis not present

## 2017-10-18 DIAGNOSIS — Z4781 Encounter for orthopedic aftercare following surgical amputation: Secondary | ICD-10-CM | POA: Diagnosis not present

## 2017-10-18 DIAGNOSIS — G8918 Other acute postprocedural pain: Secondary | ICD-10-CM | POA: Diagnosis not present

## 2017-10-18 DIAGNOSIS — R339 Retention of urine, unspecified: Secondary | ICD-10-CM | POA: Diagnosis not present

## 2017-10-18 DIAGNOSIS — I70202 Unspecified atherosclerosis of native arteries of extremities, left leg: Secondary | ICD-10-CM | POA: Diagnosis not present

## 2017-10-18 DIAGNOSIS — I120 Hypertensive chronic kidney disease with stage 5 chronic kidney disease or end stage renal disease: Secondary | ICD-10-CM | POA: Diagnosis not present

## 2017-10-18 DIAGNOSIS — I129 Hypertensive chronic kidney disease with stage 1 through stage 4 chronic kidney disease, or unspecified chronic kidney disease: Secondary | ICD-10-CM | POA: Diagnosis not present

## 2017-10-18 DIAGNOSIS — E109 Type 1 diabetes mellitus without complications: Secondary | ICD-10-CM | POA: Diagnosis not present

## 2017-10-18 DIAGNOSIS — I771 Stricture of artery: Secondary | ICD-10-CM | POA: Diagnosis not present

## 2017-10-18 DIAGNOSIS — E1065 Type 1 diabetes mellitus with hyperglycemia: Secondary | ICD-10-CM | POA: Diagnosis not present

## 2017-10-18 DIAGNOSIS — I743 Embolism and thrombosis of arteries of the lower extremities: Secondary | ICD-10-CM | POA: Diagnosis not present

## 2017-10-18 DIAGNOSIS — N529 Male erectile dysfunction, unspecified: Secondary | ICD-10-CM | POA: Diagnosis not present

## 2017-10-18 DIAGNOSIS — D62 Acute posthemorrhagic anemia: Secondary | ICD-10-CM | POA: Diagnosis not present

## 2017-10-18 DIAGNOSIS — E1022 Type 1 diabetes mellitus with diabetic chronic kidney disease: Secondary | ICD-10-CM | POA: Diagnosis not present

## 2017-10-18 DIAGNOSIS — E1021 Type 1 diabetes mellitus with diabetic nephropathy: Secondary | ICD-10-CM | POA: Diagnosis not present

## 2017-10-18 DIAGNOSIS — Z794 Long term (current) use of insulin: Secondary | ICD-10-CM | POA: Diagnosis not present

## 2017-10-18 DIAGNOSIS — N184 Chronic kidney disease, stage 4 (severe): Secondary | ICD-10-CM | POA: Diagnosis not present

## 2017-10-18 DIAGNOSIS — R9431 Abnormal electrocardiogram [ECG] [EKG]: Secondary | ICD-10-CM | POA: Diagnosis not present

## 2017-10-18 DIAGNOSIS — M79A22 Nontraumatic compartment syndrome of left lower extremity: Secondary | ICD-10-CM | POA: Diagnosis not present

## 2017-10-18 DIAGNOSIS — E7800 Pure hypercholesterolemia, unspecified: Secondary | ICD-10-CM | POA: Diagnosis not present

## 2017-10-18 DIAGNOSIS — E1040 Type 1 diabetes mellitus with diabetic neuropathy, unspecified: Secondary | ICD-10-CM | POA: Diagnosis not present

## 2017-10-18 DIAGNOSIS — E785 Hyperlipidemia, unspecified: Secondary | ICD-10-CM | POA: Diagnosis not present

## 2017-10-19 DIAGNOSIS — N189 Chronic kidney disease, unspecified: Secondary | ICD-10-CM | POA: Diagnosis not present

## 2017-10-19 DIAGNOSIS — I120 Hypertensive chronic kidney disease with stage 5 chronic kidney disease or end stage renal disease: Secondary | ICD-10-CM | POA: Diagnosis not present

## 2017-10-19 DIAGNOSIS — I998 Other disorder of circulatory system: Secondary | ICD-10-CM | POA: Diagnosis not present

## 2017-10-19 DIAGNOSIS — I743 Embolism and thrombosis of arteries of the lower extremities: Secondary | ICD-10-CM | POA: Diagnosis not present

## 2017-10-19 DIAGNOSIS — I70202 Unspecified atherosclerosis of native arteries of extremities, left leg: Secondary | ICD-10-CM | POA: Diagnosis not present

## 2017-10-20 DIAGNOSIS — T79A22A Traumatic compartment syndrome of left lower extremity, initial encounter: Secondary | ICD-10-CM | POA: Diagnosis not present

## 2017-10-20 DIAGNOSIS — I129 Hypertensive chronic kidney disease with stage 1 through stage 4 chronic kidney disease, or unspecified chronic kidney disease: Secondary | ICD-10-CM | POA: Diagnosis not present

## 2017-10-20 DIAGNOSIS — E109 Type 1 diabetes mellitus without complications: Secondary | ICD-10-CM | POA: Diagnosis not present

## 2017-10-20 DIAGNOSIS — I998 Other disorder of circulatory system: Secondary | ICD-10-CM | POA: Diagnosis not present

## 2017-10-21 DIAGNOSIS — I129 Hypertensive chronic kidney disease with stage 1 through stage 4 chronic kidney disease, or unspecified chronic kidney disease: Secondary | ICD-10-CM | POA: Diagnosis not present

## 2017-10-21 DIAGNOSIS — E785 Hyperlipidemia, unspecified: Secondary | ICD-10-CM | POA: Diagnosis not present

## 2017-10-21 DIAGNOSIS — I998 Other disorder of circulatory system: Secondary | ICD-10-CM | POA: Diagnosis not present

## 2017-10-22 DIAGNOSIS — E10319 Type 1 diabetes mellitus with unspecified diabetic retinopathy without macular edema: Secondary | ICD-10-CM | POA: Diagnosis not present

## 2017-10-22 DIAGNOSIS — I998 Other disorder of circulatory system: Secondary | ICD-10-CM | POA: Diagnosis not present

## 2017-10-22 DIAGNOSIS — I129 Hypertensive chronic kidney disease with stage 1 through stage 4 chronic kidney disease, or unspecified chronic kidney disease: Secondary | ICD-10-CM | POA: Diagnosis not present

## 2017-10-22 DIAGNOSIS — D62 Acute posthemorrhagic anemia: Secondary | ICD-10-CM | POA: Diagnosis not present

## 2017-10-22 DIAGNOSIS — R9431 Abnormal electrocardiogram [ECG] [EKG]: Secondary | ICD-10-CM | POA: Diagnosis not present

## 2017-10-22 DIAGNOSIS — E1022 Type 1 diabetes mellitus with diabetic chronic kidney disease: Secondary | ICD-10-CM | POA: Diagnosis not present

## 2017-10-22 DIAGNOSIS — E1040 Type 1 diabetes mellitus with diabetic neuropathy, unspecified: Secondary | ICD-10-CM | POA: Diagnosis not present

## 2017-10-22 DIAGNOSIS — N183 Chronic kidney disease, stage 3 (moderate): Secondary | ICD-10-CM | POA: Diagnosis not present

## 2017-10-23 DIAGNOSIS — I998 Other disorder of circulatory system: Secondary | ICD-10-CM | POA: Diagnosis not present

## 2017-10-23 DIAGNOSIS — E1040 Type 1 diabetes mellitus with diabetic neuropathy, unspecified: Secondary | ICD-10-CM | POA: Diagnosis not present

## 2017-10-23 DIAGNOSIS — I739 Peripheral vascular disease, unspecified: Secondary | ICD-10-CM | POA: Diagnosis not present

## 2017-10-23 DIAGNOSIS — N183 Chronic kidney disease, stage 3 (moderate): Secondary | ICD-10-CM | POA: Diagnosis not present

## 2017-10-23 DIAGNOSIS — I70202 Unspecified atherosclerosis of native arteries of extremities, left leg: Secondary | ICD-10-CM | POA: Diagnosis not present

## 2017-10-23 DIAGNOSIS — E1051 Type 1 diabetes mellitus with diabetic peripheral angiopathy without gangrene: Secondary | ICD-10-CM | POA: Diagnosis not present

## 2017-10-23 DIAGNOSIS — E1029 Type 1 diabetes mellitus with other diabetic kidney complication: Secondary | ICD-10-CM | POA: Diagnosis not present

## 2017-10-24 DIAGNOSIS — E1040 Type 1 diabetes mellitus with diabetic neuropathy, unspecified: Secondary | ICD-10-CM | POA: Diagnosis not present

## 2017-10-24 DIAGNOSIS — G8918 Other acute postprocedural pain: Secondary | ICD-10-CM | POA: Diagnosis not present

## 2017-10-24 DIAGNOSIS — N183 Chronic kidney disease, stage 3 (moderate): Secondary | ICD-10-CM | POA: Diagnosis not present

## 2017-10-24 DIAGNOSIS — R339 Retention of urine, unspecified: Secondary | ICD-10-CM | POA: Diagnosis not present

## 2017-10-24 DIAGNOSIS — Z4781 Encounter for orthopedic aftercare following surgical amputation: Secondary | ICD-10-CM | POA: Diagnosis not present

## 2017-10-24 DIAGNOSIS — E1029 Type 1 diabetes mellitus with other diabetic kidney complication: Secondary | ICD-10-CM | POA: Diagnosis not present

## 2017-10-24 DIAGNOSIS — E1051 Type 1 diabetes mellitus with diabetic peripheral angiopathy without gangrene: Secondary | ICD-10-CM | POA: Diagnosis not present

## 2017-10-25 DIAGNOSIS — Z89512 Acquired absence of left leg below knee: Secondary | ICD-10-CM | POA: Diagnosis not present

## 2017-10-25 DIAGNOSIS — Z4781 Encounter for orthopedic aftercare following surgical amputation: Secondary | ICD-10-CM | POA: Diagnosis not present

## 2017-10-26 DIAGNOSIS — N184 Chronic kidney disease, stage 4 (severe): Secondary | ICD-10-CM | POA: Diagnosis not present

## 2017-10-26 DIAGNOSIS — E1022 Type 1 diabetes mellitus with diabetic chronic kidney disease: Secondary | ICD-10-CM | POA: Diagnosis not present

## 2017-10-26 DIAGNOSIS — Z89512 Acquired absence of left leg below knee: Secondary | ICD-10-CM | POA: Diagnosis not present

## 2017-10-26 DIAGNOSIS — E1040 Type 1 diabetes mellitus with diabetic neuropathy, unspecified: Secondary | ICD-10-CM | POA: Diagnosis not present

## 2017-10-26 DIAGNOSIS — Z4781 Encounter for orthopedic aftercare following surgical amputation: Secondary | ICD-10-CM | POA: Diagnosis not present

## 2017-10-26 DIAGNOSIS — E10319 Type 1 diabetes mellitus with unspecified diabetic retinopathy without macular edema: Secondary | ICD-10-CM | POA: Diagnosis not present

## 2017-10-28 DIAGNOSIS — Z4781 Encounter for orthopedic aftercare following surgical amputation: Secondary | ICD-10-CM | POA: Diagnosis not present

## 2017-10-28 DIAGNOSIS — E7800 Pure hypercholesterolemia, unspecified: Secondary | ICD-10-CM | POA: Diagnosis not present

## 2017-10-28 DIAGNOSIS — E785 Hyperlipidemia, unspecified: Secondary | ICD-10-CM | POA: Diagnosis not present

## 2017-10-28 DIAGNOSIS — E878 Other disorders of electrolyte and fluid balance, not elsewhere classified: Secondary | ICD-10-CM | POA: Diagnosis not present

## 2017-10-28 DIAGNOSIS — I739 Peripheral vascular disease, unspecified: Secondary | ICD-10-CM | POA: Diagnosis not present

## 2017-10-28 DIAGNOSIS — E109 Type 1 diabetes mellitus without complications: Secondary | ICD-10-CM | POA: Diagnosis not present

## 2017-10-28 DIAGNOSIS — K5900 Constipation, unspecified: Secondary | ICD-10-CM | POA: Diagnosis not present

## 2017-10-28 DIAGNOSIS — E875 Hyperkalemia: Secondary | ICD-10-CM | POA: Diagnosis not present

## 2017-10-28 DIAGNOSIS — E871 Hypo-osmolality and hyponatremia: Secondary | ICD-10-CM | POA: Diagnosis not present

## 2017-10-28 DIAGNOSIS — D62 Acute posthemorrhagic anemia: Secondary | ICD-10-CM | POA: Diagnosis not present

## 2017-10-28 DIAGNOSIS — E10319 Type 1 diabetes mellitus with unspecified diabetic retinopathy without macular edema: Secondary | ICD-10-CM | POA: Diagnosis not present

## 2017-10-28 DIAGNOSIS — I129 Hypertensive chronic kidney disease with stage 1 through stage 4 chronic kidney disease, or unspecified chronic kidney disease: Secondary | ICD-10-CM | POA: Diagnosis not present

## 2017-10-28 DIAGNOSIS — Z794 Long term (current) use of insulin: Secondary | ICD-10-CM | POA: Diagnosis not present

## 2017-10-28 DIAGNOSIS — E1065 Type 1 diabetes mellitus with hyperglycemia: Secondary | ICD-10-CM | POA: Diagnosis not present

## 2017-10-28 DIAGNOSIS — E1022 Type 1 diabetes mellitus with diabetic chronic kidney disease: Secondary | ICD-10-CM | POA: Diagnosis not present

## 2017-10-28 DIAGNOSIS — E1040 Type 1 diabetes mellitus with diabetic neuropathy, unspecified: Secondary | ICD-10-CM | POA: Diagnosis not present

## 2017-10-28 DIAGNOSIS — Z7982 Long term (current) use of aspirin: Secondary | ICD-10-CM | POA: Diagnosis not present

## 2017-10-28 DIAGNOSIS — Z89512 Acquired absence of left leg below knee: Secondary | ICD-10-CM | POA: Diagnosis not present

## 2017-10-28 DIAGNOSIS — E1051 Type 1 diabetes mellitus with diabetic peripheral angiopathy without gangrene: Secondary | ICD-10-CM | POA: Diagnosis not present

## 2017-10-28 DIAGNOSIS — N183 Chronic kidney disease, stage 3 (moderate): Secondary | ICD-10-CM | POA: Diagnosis not present

## 2017-10-28 DIAGNOSIS — E1021 Type 1 diabetes mellitus with diabetic nephropathy: Secondary | ICD-10-CM | POA: Diagnosis not present

## 2017-10-28 DIAGNOSIS — L03116 Cellulitis of left lower limb: Secondary | ICD-10-CM | POA: Diagnosis not present

## 2017-10-28 DIAGNOSIS — E1122 Type 2 diabetes mellitus with diabetic chronic kidney disease: Secondary | ICD-10-CM | POA: Diagnosis not present

## 2017-10-28 DIAGNOSIS — I998 Other disorder of circulatory system: Secondary | ICD-10-CM | POA: Diagnosis not present

## 2017-10-28 DIAGNOSIS — N179 Acute kidney failure, unspecified: Secondary | ICD-10-CM | POA: Diagnosis not present

## 2017-10-28 DIAGNOSIS — Z7409 Other reduced mobility: Secondary | ICD-10-CM | POA: Diagnosis not present

## 2017-10-29 DIAGNOSIS — E1065 Type 1 diabetes mellitus with hyperglycemia: Secondary | ICD-10-CM | POA: Diagnosis not present

## 2017-10-29 DIAGNOSIS — E1021 Type 1 diabetes mellitus with diabetic nephropathy: Secondary | ICD-10-CM | POA: Diagnosis not present

## 2017-10-29 DIAGNOSIS — K5900 Constipation, unspecified: Secondary | ICD-10-CM | POA: Diagnosis not present

## 2017-10-29 DIAGNOSIS — E10319 Type 1 diabetes mellitus with unspecified diabetic retinopathy without macular edema: Secondary | ICD-10-CM | POA: Diagnosis not present

## 2017-10-29 DIAGNOSIS — Z89512 Acquired absence of left leg below knee: Secondary | ICD-10-CM | POA: Diagnosis not present

## 2017-10-29 DIAGNOSIS — E1040 Type 1 diabetes mellitus with diabetic neuropathy, unspecified: Secondary | ICD-10-CM | POA: Diagnosis not present

## 2017-10-29 DIAGNOSIS — E1122 Type 2 diabetes mellitus with diabetic chronic kidney disease: Secondary | ICD-10-CM | POA: Diagnosis not present

## 2017-10-30 DIAGNOSIS — N183 Chronic kidney disease, stage 3 (moderate): Secondary | ICD-10-CM | POA: Diagnosis not present

## 2017-10-30 DIAGNOSIS — E1021 Type 1 diabetes mellitus with diabetic nephropathy: Secondary | ICD-10-CM | POA: Diagnosis not present

## 2017-10-30 DIAGNOSIS — E1040 Type 1 diabetes mellitus with diabetic neuropathy, unspecified: Secondary | ICD-10-CM | POA: Diagnosis not present

## 2017-10-30 DIAGNOSIS — K5900 Constipation, unspecified: Secondary | ICD-10-CM | POA: Diagnosis not present

## 2017-10-30 DIAGNOSIS — Z89512 Acquired absence of left leg below knee: Secondary | ICD-10-CM | POA: Diagnosis not present

## 2017-10-30 DIAGNOSIS — E10319 Type 1 diabetes mellitus with unspecified diabetic retinopathy without macular edema: Secondary | ICD-10-CM | POA: Diagnosis not present

## 2017-10-31 DIAGNOSIS — E1021 Type 1 diabetes mellitus with diabetic nephropathy: Secondary | ICD-10-CM | POA: Diagnosis not present

## 2017-10-31 DIAGNOSIS — I998 Other disorder of circulatory system: Secondary | ICD-10-CM | POA: Diagnosis not present

## 2017-10-31 DIAGNOSIS — E10319 Type 1 diabetes mellitus with unspecified diabetic retinopathy without macular edema: Secondary | ICD-10-CM | POA: Diagnosis not present

## 2017-10-31 DIAGNOSIS — N183 Chronic kidney disease, stage 3 (moderate): Secondary | ICD-10-CM | POA: Diagnosis not present

## 2017-10-31 DIAGNOSIS — E1040 Type 1 diabetes mellitus with diabetic neuropathy, unspecified: Secondary | ICD-10-CM | POA: Diagnosis not present

## 2017-10-31 DIAGNOSIS — Z4781 Encounter for orthopedic aftercare following surgical amputation: Secondary | ICD-10-CM | POA: Diagnosis not present

## 2017-11-01 DIAGNOSIS — Z4781 Encounter for orthopedic aftercare following surgical amputation: Secondary | ICD-10-CM | POA: Diagnosis not present

## 2017-11-01 DIAGNOSIS — I998 Other disorder of circulatory system: Secondary | ICD-10-CM | POA: Diagnosis not present

## 2017-11-02 DIAGNOSIS — E1040 Type 1 diabetes mellitus with diabetic neuropathy, unspecified: Secondary | ICD-10-CM | POA: Diagnosis not present

## 2017-11-02 DIAGNOSIS — E1021 Type 1 diabetes mellitus with diabetic nephropathy: Secondary | ICD-10-CM | POA: Diagnosis not present

## 2017-11-02 DIAGNOSIS — E10319 Type 1 diabetes mellitus with unspecified diabetic retinopathy without macular edema: Secondary | ICD-10-CM | POA: Diagnosis not present

## 2017-11-02 DIAGNOSIS — N183 Chronic kidney disease, stage 3 (moderate): Secondary | ICD-10-CM | POA: Diagnosis not present

## 2017-11-03 DIAGNOSIS — E1021 Type 1 diabetes mellitus with diabetic nephropathy: Secondary | ICD-10-CM | POA: Diagnosis not present

## 2017-11-03 DIAGNOSIS — E1040 Type 1 diabetes mellitus with diabetic neuropathy, unspecified: Secondary | ICD-10-CM | POA: Diagnosis not present

## 2017-11-03 DIAGNOSIS — E10319 Type 1 diabetes mellitus with unspecified diabetic retinopathy without macular edema: Secondary | ICD-10-CM | POA: Diagnosis not present

## 2017-11-03 DIAGNOSIS — E878 Other disorders of electrolyte and fluid balance, not elsewhere classified: Secondary | ICD-10-CM | POA: Diagnosis not present

## 2017-11-03 DIAGNOSIS — N183 Chronic kidney disease, stage 3 (moderate): Secondary | ICD-10-CM | POA: Diagnosis not present

## 2017-11-03 DIAGNOSIS — E875 Hyperkalemia: Secondary | ICD-10-CM | POA: Diagnosis not present

## 2017-11-04 DIAGNOSIS — E109 Type 1 diabetes mellitus without complications: Secondary | ICD-10-CM | POA: Diagnosis not present

## 2017-11-04 DIAGNOSIS — Z7409 Other reduced mobility: Secondary | ICD-10-CM | POA: Diagnosis not present

## 2017-11-04 DIAGNOSIS — E1022 Type 1 diabetes mellitus with diabetic chronic kidney disease: Secondary | ICD-10-CM | POA: Diagnosis not present

## 2017-11-04 DIAGNOSIS — E1021 Type 1 diabetes mellitus with diabetic nephropathy: Secondary | ICD-10-CM | POA: Diagnosis not present

## 2017-11-04 DIAGNOSIS — Z89512 Acquired absence of left leg below knee: Secondary | ICD-10-CM | POA: Diagnosis not present

## 2017-11-04 DIAGNOSIS — I739 Peripheral vascular disease, unspecified: Secondary | ICD-10-CM | POA: Diagnosis not present

## 2017-11-05 DIAGNOSIS — I998 Other disorder of circulatory system: Secondary | ICD-10-CM | POA: Diagnosis not present

## 2017-11-05 DIAGNOSIS — E1040 Type 1 diabetes mellitus with diabetic neuropathy, unspecified: Secondary | ICD-10-CM | POA: Diagnosis not present

## 2017-11-05 DIAGNOSIS — I739 Peripheral vascular disease, unspecified: Secondary | ICD-10-CM | POA: Diagnosis not present

## 2017-11-05 DIAGNOSIS — E1022 Type 1 diabetes mellitus with diabetic chronic kidney disease: Secondary | ICD-10-CM | POA: Diagnosis not present

## 2017-11-05 DIAGNOSIS — Z4781 Encounter for orthopedic aftercare following surgical amputation: Secondary | ICD-10-CM | POA: Diagnosis not present

## 2017-11-05 DIAGNOSIS — I129 Hypertensive chronic kidney disease with stage 1 through stage 4 chronic kidney disease, or unspecified chronic kidney disease: Secondary | ICD-10-CM | POA: Diagnosis not present

## 2017-11-05 DIAGNOSIS — Z89512 Acquired absence of left leg below knee: Secondary | ICD-10-CM | POA: Diagnosis not present

## 2017-11-05 DIAGNOSIS — E10319 Type 1 diabetes mellitus with unspecified diabetic retinopathy without macular edema: Secondary | ICD-10-CM | POA: Diagnosis not present

## 2017-11-05 DIAGNOSIS — Z7409 Other reduced mobility: Secondary | ICD-10-CM | POA: Diagnosis not present

## 2017-11-05 DIAGNOSIS — N179 Acute kidney failure, unspecified: Secondary | ICD-10-CM | POA: Diagnosis not present

## 2017-11-08 DIAGNOSIS — I739 Peripheral vascular disease, unspecified: Secondary | ICD-10-CM | POA: Diagnosis not present

## 2017-11-08 DIAGNOSIS — N289 Disorder of kidney and ureter, unspecified: Secondary | ICD-10-CM | POA: Diagnosis not present

## 2017-11-08 DIAGNOSIS — Z89512 Acquired absence of left leg below knee: Secondary | ICD-10-CM | POA: Diagnosis not present

## 2017-11-08 DIAGNOSIS — E782 Mixed hyperlipidemia: Secondary | ICD-10-CM | POA: Diagnosis not present

## 2017-12-04 DIAGNOSIS — Z89512 Acquired absence of left leg below knee: Secondary | ICD-10-CM | POA: Diagnosis not present

## 2017-12-05 DIAGNOSIS — Z89512 Acquired absence of left leg below knee: Secondary | ICD-10-CM | POA: Diagnosis not present

## 2018-01-04 DIAGNOSIS — Z89512 Acquired absence of left leg below knee: Secondary | ICD-10-CM | POA: Diagnosis not present

## 2018-01-15 DIAGNOSIS — Z89512 Acquired absence of left leg below knee: Secondary | ICD-10-CM | POA: Diagnosis not present

## 2018-02-04 DIAGNOSIS — Z89512 Acquired absence of left leg below knee: Secondary | ICD-10-CM | POA: Diagnosis not present

## 2018-03-06 DIAGNOSIS — Z89512 Acquired absence of left leg below knee: Secondary | ICD-10-CM | POA: Diagnosis not present

## 2018-03-17 DIAGNOSIS — N183 Chronic kidney disease, stage 3 (moderate): Secondary | ICD-10-CM | POA: Diagnosis not present

## 2018-03-17 DIAGNOSIS — Z6826 Body mass index (BMI) 26.0-26.9, adult: Secondary | ICD-10-CM | POA: Diagnosis not present

## 2018-03-17 DIAGNOSIS — E11319 Type 2 diabetes mellitus with unspecified diabetic retinopathy without macular edema: Secondary | ICD-10-CM | POA: Diagnosis not present

## 2018-03-17 DIAGNOSIS — E1051 Type 1 diabetes mellitus with diabetic peripheral angiopathy without gangrene: Secondary | ICD-10-CM | POA: Diagnosis not present

## 2018-03-17 DIAGNOSIS — I10 Essential (primary) hypertension: Secondary | ICD-10-CM | POA: Diagnosis not present

## 2018-03-28 DIAGNOSIS — B355 Tinea imbricata: Secondary | ICD-10-CM | POA: Diagnosis not present

## 2018-04-06 DIAGNOSIS — Z89512 Acquired absence of left leg below knee: Secondary | ICD-10-CM | POA: Diagnosis not present

## 2018-10-20 DIAGNOSIS — E1142 Type 2 diabetes mellitus with diabetic polyneuropathy: Secondary | ICD-10-CM | POA: Diagnosis not present

## 2018-10-20 DIAGNOSIS — E1065 Type 1 diabetes mellitus with hyperglycemia: Secondary | ICD-10-CM | POA: Diagnosis not present

## 2018-10-20 DIAGNOSIS — E1029 Type 1 diabetes mellitus with other diabetic kidney complication: Secondary | ICD-10-CM | POA: Diagnosis not present

## 2018-10-20 DIAGNOSIS — E782 Mixed hyperlipidemia: Secondary | ICD-10-CM | POA: Diagnosis not present

## 2018-10-20 DIAGNOSIS — N183 Chronic kidney disease, stage 3 (moderate): Secondary | ICD-10-CM | POA: Diagnosis not present

## 2019-07-14 DIAGNOSIS — E782 Mixed hyperlipidemia: Secondary | ICD-10-CM | POA: Diagnosis not present

## 2019-07-14 DIAGNOSIS — E1142 Type 2 diabetes mellitus with diabetic polyneuropathy: Secondary | ICD-10-CM | POA: Diagnosis not present

## 2019-07-14 DIAGNOSIS — Z125 Encounter for screening for malignant neoplasm of prostate: Secondary | ICD-10-CM | POA: Diagnosis not present

## 2019-07-14 DIAGNOSIS — E11319 Type 2 diabetes mellitus with unspecified diabetic retinopathy without macular edema: Secondary | ICD-10-CM | POA: Diagnosis not present

## 2019-07-14 DIAGNOSIS — E1029 Type 1 diabetes mellitus with other diabetic kidney complication: Secondary | ICD-10-CM | POA: Diagnosis not present

## 2019-07-14 DIAGNOSIS — E1065 Type 1 diabetes mellitus with hyperglycemia: Secondary | ICD-10-CM | POA: Diagnosis not present

## 2019-07-31 DIAGNOSIS — I10 Essential (primary) hypertension: Secondary | ICD-10-CM | POA: Diagnosis not present

## 2019-07-31 DIAGNOSIS — M7021 Olecranon bursitis, right elbow: Secondary | ICD-10-CM | POA: Diagnosis not present

## 2019-07-31 DIAGNOSIS — Z6827 Body mass index (BMI) 27.0-27.9, adult: Secondary | ICD-10-CM | POA: Diagnosis not present

## 2019-07-31 DIAGNOSIS — E663 Overweight: Secondary | ICD-10-CM | POA: Diagnosis not present

## 2019-08-14 DIAGNOSIS — S78112S Complete traumatic amputation at level between left hip and knee, sequela: Secondary | ICD-10-CM | POA: Diagnosis not present

## 2019-08-14 DIAGNOSIS — Z9710 Presence of artificial limb (complete) (partial), unspecified: Secondary | ICD-10-CM | POA: Diagnosis not present

## 2019-08-14 DIAGNOSIS — E1029 Type 1 diabetes mellitus with other diabetic kidney complication: Secondary | ICD-10-CM | POA: Diagnosis not present

## 2019-08-14 DIAGNOSIS — Z89512 Acquired absence of left leg below knee: Secondary | ICD-10-CM | POA: Diagnosis not present

## 2020-09-05 ENCOUNTER — Other Ambulatory Visit: Payer: Self-pay | Admitting: Nephrology

## 2020-09-05 DIAGNOSIS — N184 Chronic kidney disease, stage 4 (severe): Secondary | ICD-10-CM

## 2020-09-26 ENCOUNTER — Ambulatory Visit
Admission: RE | Admit: 2020-09-26 | Discharge: 2020-09-26 | Disposition: A | Discharge status: Home / Self Care | Payer: Medicare Other | Source: Ambulatory Visit | Attending: Nephrology | Admitting: Nephrology

## 2020-09-26 DIAGNOSIS — N184 Chronic kidney disease, stage 4 (severe): Secondary | ICD-10-CM

## 2021-09-14 ENCOUNTER — Telehealth: Payer: Self-pay

## 2021-10-11 ENCOUNTER — Ambulatory Visit (INDEPENDENT_AMBULATORY_CARE_PROVIDER_SITE_OTHER): Payer: Medicare Other | Admitting: Vascular Surgery

## 2021-10-11 ENCOUNTER — Encounter: Payer: Self-pay | Admitting: Vascular Surgery

## 2021-10-11 VITALS — BP 182/78 | HR 78 | Temp 97.9°F | Resp 20 | Ht 71.0 in | Wt 194.0 lb

## 2021-10-11 DIAGNOSIS — N189 Chronic kidney disease, unspecified: Secondary | ICD-10-CM | POA: Diagnosis not present

## 2021-10-11 NOTE — Progress Notes (Signed)
Patient ID: Wayne Hartman, male   DOB: 1955/11/10, 66 y.o.   MRN: [id number]  Reason for Consult: New Patient (Initial Visit)   Referred by Myer Peer, MD  Subjective:     HPI:  Wayne Hartman is a 66 y.o. male with chronic kidney disease with known peripheral arterial disease and previous left below-knee amputation.  He presents today for evaluation of peritoneal dialysis.  He has never had abdominal surgery.  He is a type I diabetic and does use injections in his abdomen.  He has never been on dialysis before.  He continues to work daily.  He does complain of claudication in the right lower extremity but does not affect him for about 20 to 30 minutes of walking.  Past Medical History:  Diagnosis Date   Abnormal echocardiogram 08/02/2017   CKD (chronic kidney disease) 08/02/2017   Erectile dysfunction    Heart murmur 08/02/2017   Hyperlipidemia 08/02/2017   Hypertension 08/02/2017   Necrotic toes ([place]) 08/02/2017   PAD (peripheral artery disease) ([place]) 08/02/2017   Peripheral neuropathy 08/02/2017   Type 1 diabetes, uncontrolled, with renal manifestation 08/02/2017   Family History  Problem Relation Age of Onset   Diabetes Mother    Celiac disease Mother    Other Father        CABG   Diabetes Sister    Celiac disease Sister    Past Surgical History:  Procedure Laterality Date   CATARACT EXTRACTION Right 2009   Dr. Zigmund Daniel at [place]   TEE WITHOUT CARDIOVERSION N/A 08/06/2017   Procedure: TRANSESOPHAGEAL ECHOCARDIOGRAM (TEE);  Surgeon: Josue Hector, MD;  Location: [facility] ENDOSCOPY;  Service: Cardiovascular;  Laterality: N/A;   vitrous fluid replacement Right 2001    Short Social History:  Social History   Tobacco Use   Smoking status: Never   Smokeless tobacco: Never  Substance Use Topics   Alcohol use: Not Currently    No Known Allergies  Current Outpatient Medications  Medication Sig Dispense Refill   aspirin 81 MG  chewable tablet Chew by mouth.     calcium acetate, Phos Binder, (PHOSLYRA) 667 MG/5ML SOLN Take by mouth 3 (three) times daily with meals.     chlorthalidone (HYGROTON) 25 MG tablet Take 25 mg by mouth daily.     cilostazol (PLETAL) 100 MG tablet Take 100 mg by mouth 2 (two) times daily.     DILT-XR 240 MG 24 hr capsule Take 240 mg by mouth daily.     ezetimibe (ZETIA) 10 MG tablet Take 10 mg by mouth daily.     hydrALAZINE (APRESOLINE) 50 MG tablet Take 50 mg by mouth 3 (three) times daily.     insulin aspart (NOVOLOG) 100 UNIT/ML injection Inject 4-8 Units into the skin 3 (three) times daily with meals. Per sliding scale     insulin glargine (LANTUS) 100 UNIT/ML injection Inject 24 Units into the skin every morning.      Latanoprostene Bunod (VYZULTA) 0.024 % SOLN Apply to eye.     rosuvastatin (CRESTOR) 20 MG tablet Take 20 mg by mouth at bedtime.     sodium bicarbonate 650 MG tablet Take 650 mg by mouth 4 (four) times daily.     No current facility-administered medications for this visit.    Review of Systems  Constitutional:  Constitutional negative. HENT: HENT negative.  Eyes: Eyes negative.  Respiratory: Respiratory negative.  Cardiovascular: Positive for claudication.  GI: Gastrointestinal  negative.  Musculoskeletal: Musculoskeletal negative.  Skin: Skin negative.  Neurological: Neurological negative. Hematologic: Hematologic/lymphatic negative.  Psychiatric: Psychiatric negative.        Objective:  Objective   Vitals:   10/11/21 1022  BP: (!) 182/78  Pulse: 78  Resp: 20  Temp: 97.9 F (36.6 C)  SpO2: 96%  Weight: 194 lb (88 kg)  Height: 5\' 11"  (1.803 m)   Body mass index is 27.06 kg/m.  Physical Exam HENT:     Head: Normocephalic.     Nose: Nose normal.  Eyes:     Pupils: Pupils are equal, round, and reactive to light.  Cardiovascular:     Pulses:          Femoral pulses are 2+ on the right side and 2+ on the left side.      Popliteal pulses are 0 on  the right side.     Comments: Left below-knee amputation Pulmonary:     Effort: Pulmonary effort is normal.  Abdominal:     General: Abdomen is flat.     Palpations: Abdomen is soft.     Comments: No previous incisions, glucometer left lower quadrant  Musculoskeletal:     Right lower leg: No edema.     Left lower leg: No edema.  Skin:    General: Skin is warm and dry.     Capillary Refill: Capillary refill takes less than 2 seconds.  Neurological:     General: No focal deficit present.     Mental Status: He is alert.     Data: No studies today     Assessment/Plan:    66 year old male with chronic kidney disease now requiring dialysis remains asymptomatic.  Plan will be for peritoneal dialysis catheter when he is closer to dialysis.  He does not need further evaluation and can be scheduled when ready as long as he is comfortable with this.  Patient has right lower extremity claudication with physical exam findings consistent with SFA occlusion/stenosis.  Given advanced chronic kidney disease would not want to use any contrast and patient continues to work at his current level of claudication and so we will defer evaluation at this time.     [name] MD [facility]

## 2022-02-05 IMAGING — US US RENAL ARTERY STENOSIS
1 series · 14 of 25 positions shown · non-contrast
Comparison: None.

CLINICAL DATA: Chronic kidney disease stage 4

EXAM:
RENAL/URINARY TRACT ULTRASOUND
RENAL DUPLEX DOPPLER ULTRASOUND

[Series 1: us renal artery stenosis · 0.28mm/px · 14 of 66 slices shown]
[im 1/66]
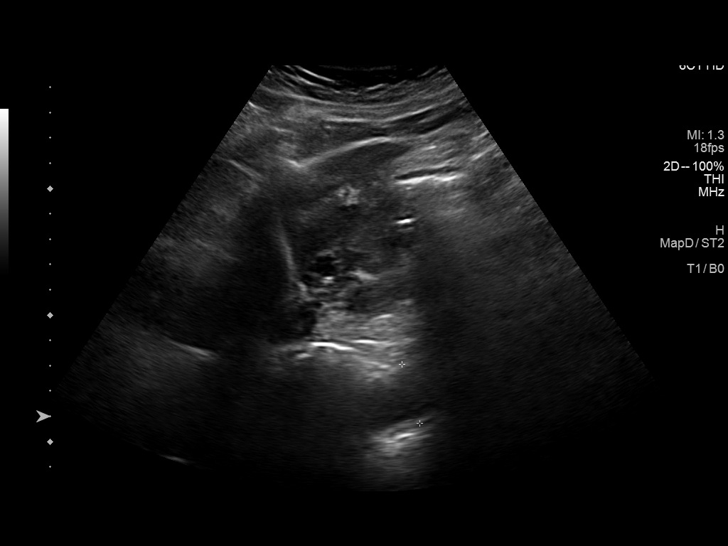
[im 6/66]
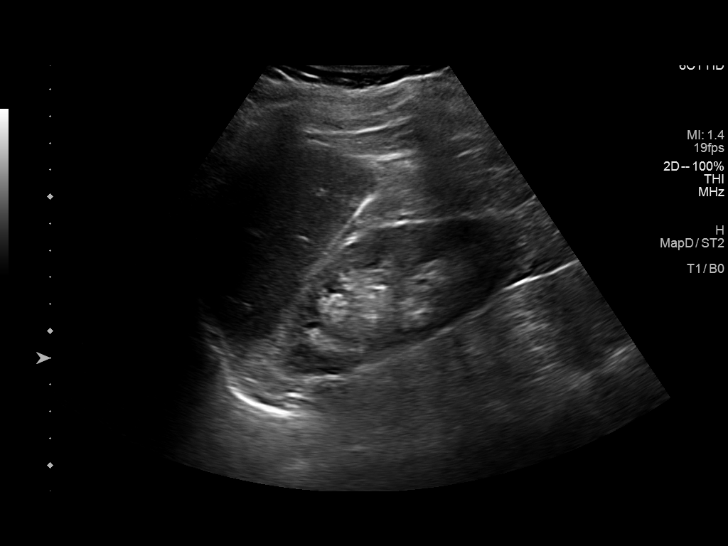
[im 11/66]
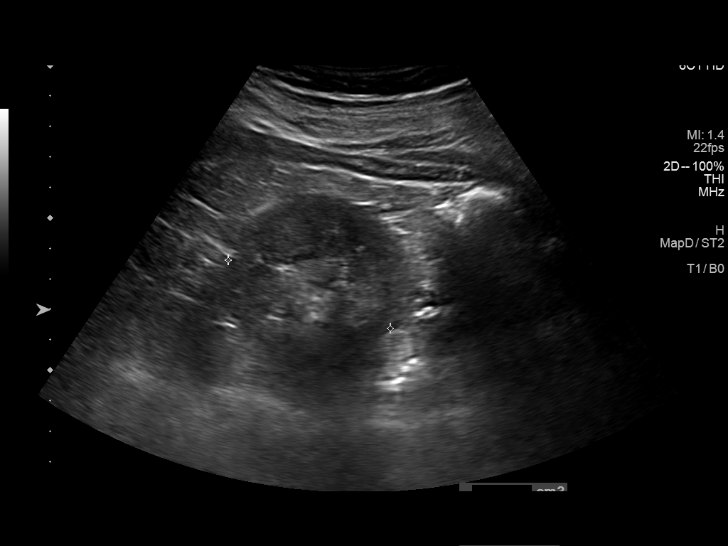
[im 17/66]
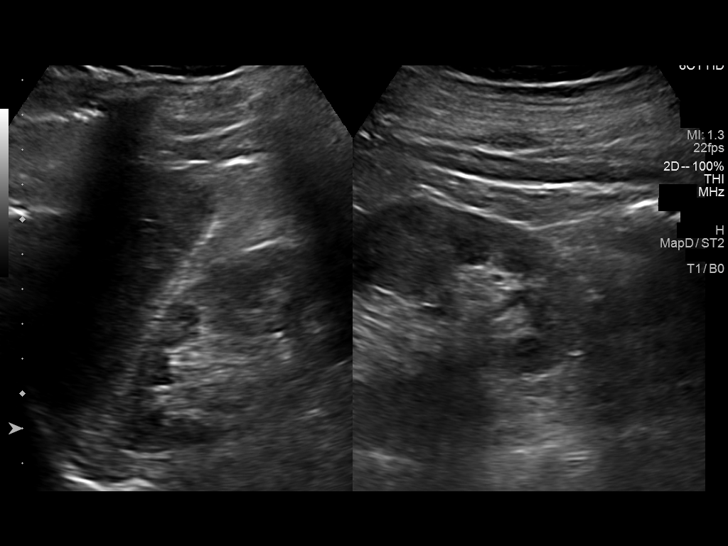
[im 22/66]
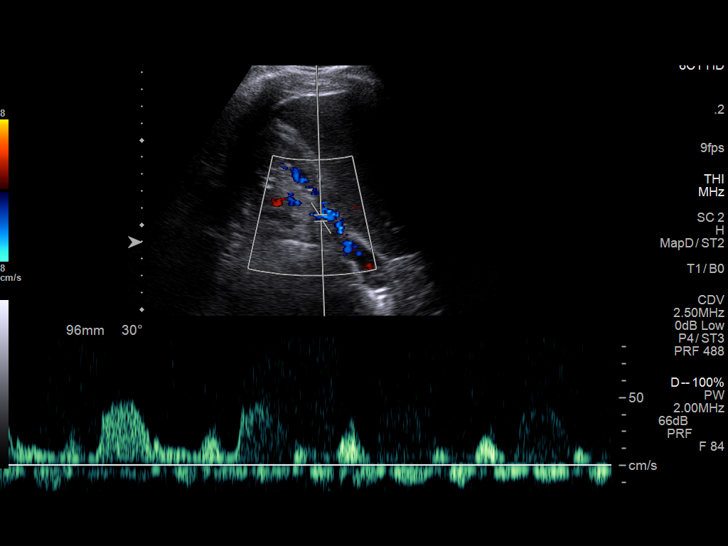
[im 25/66]
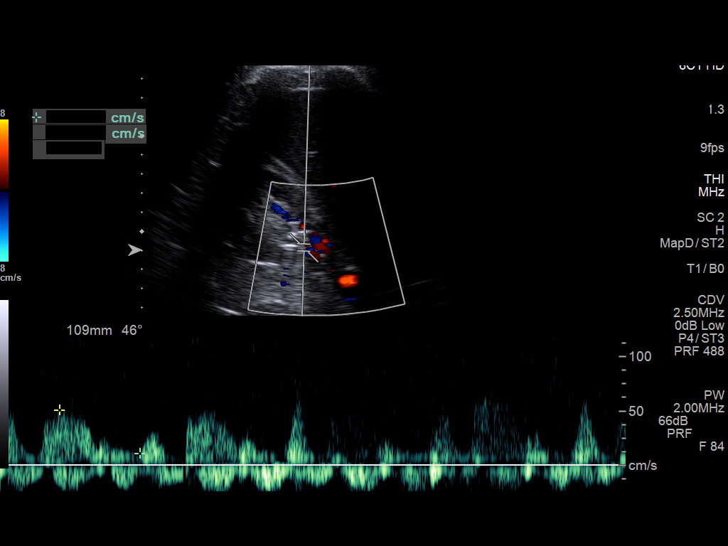
[im 30/66]
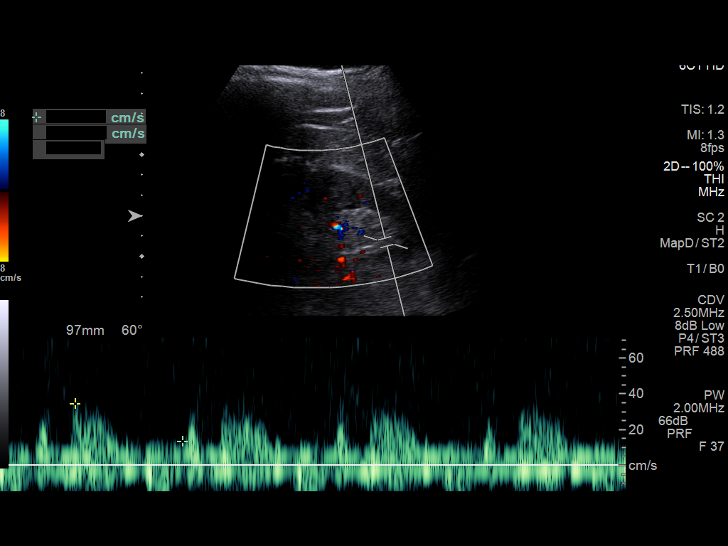
[im 36/66]
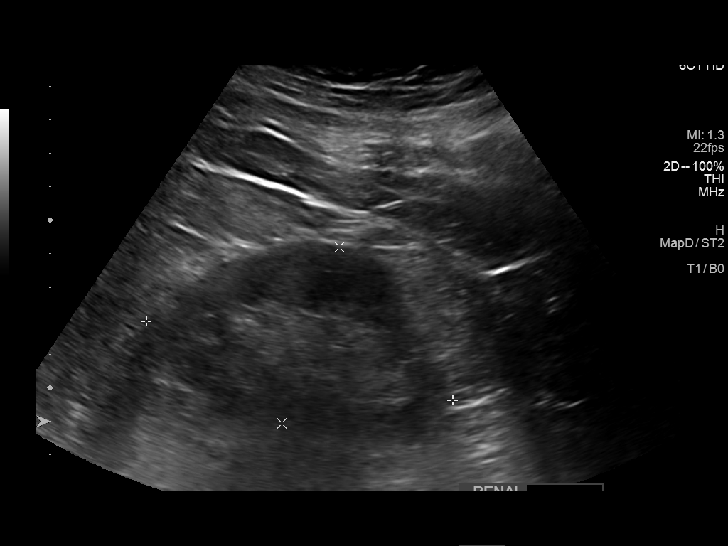
[im 41/66]
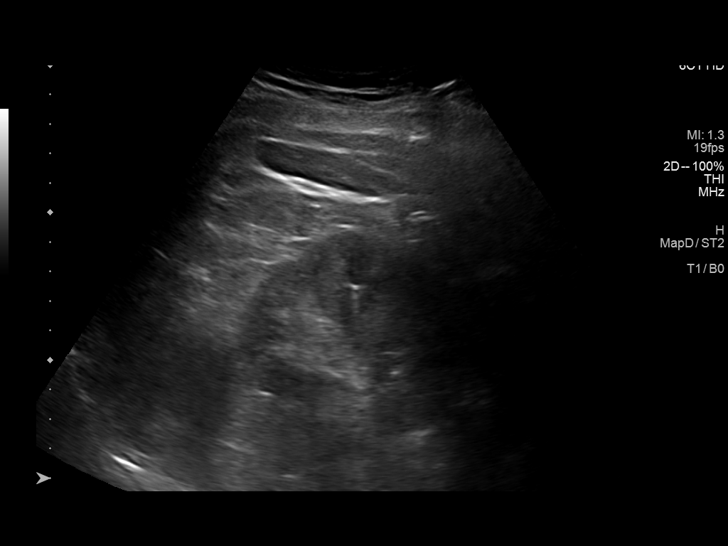
[im 44/66]
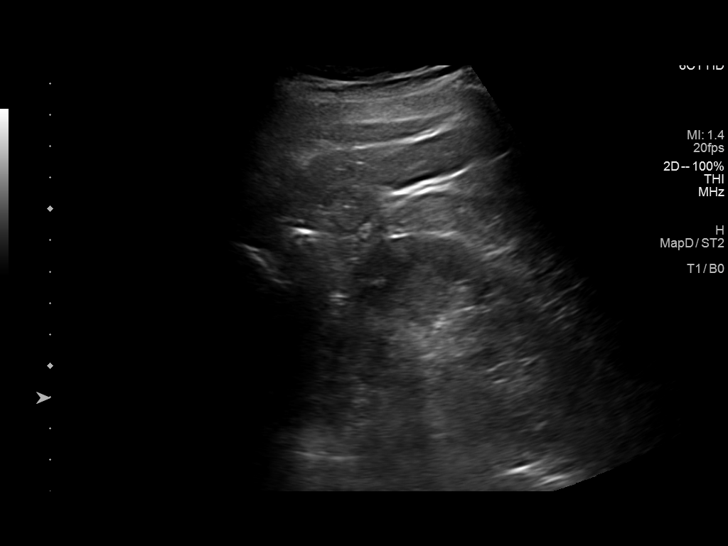
[im 49/66]
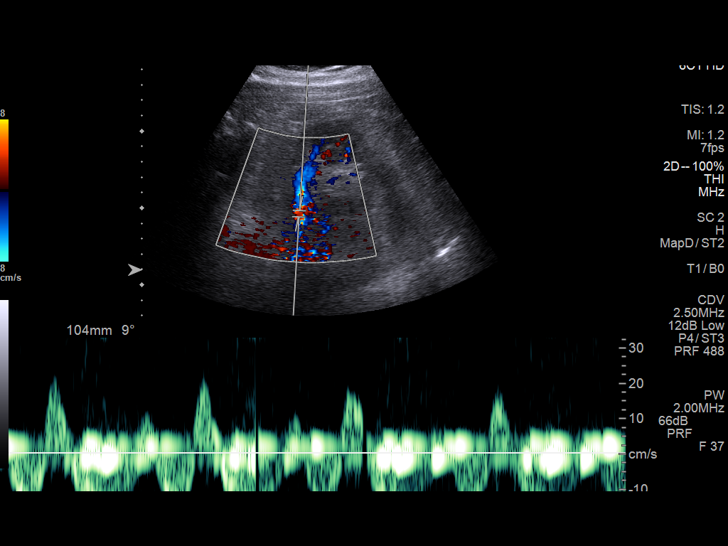
[im 55/66]
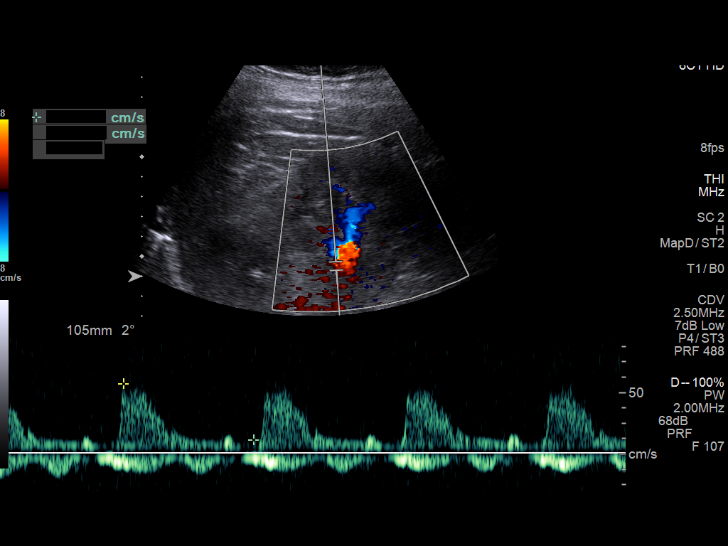
[im 60/66]
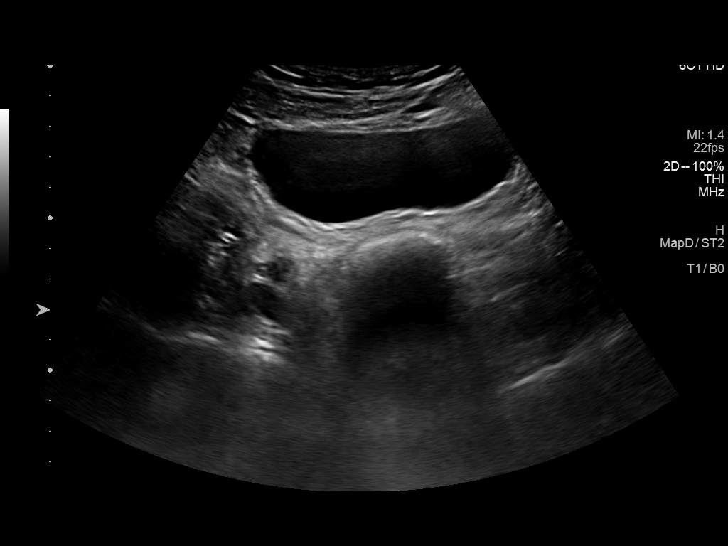
[im 66/66]
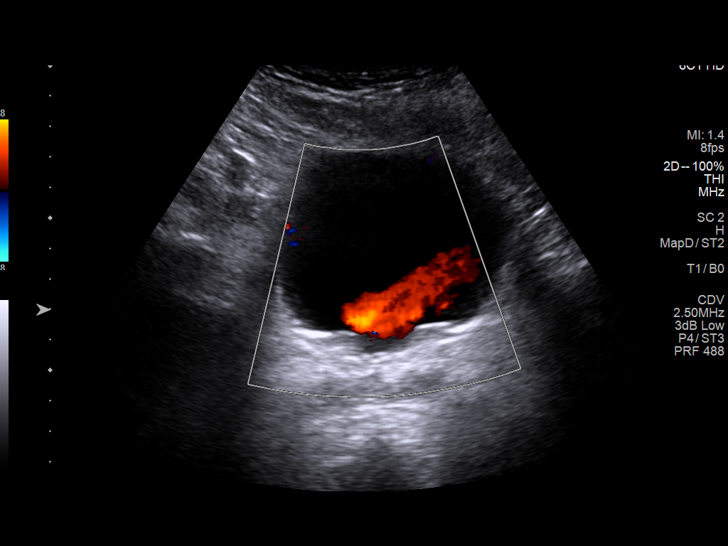

[14 of 25 positions shown; findings below may reference images not displayed]

FINDINGS: Right Kidney:

Length: 11.0 cm. Echogenicity within normal limits. No mass or
hydronephrosis visualized.

Left Kidney:

Length: 9.4 cm. Echogenicity within normal limits. No mass or
hydronephrosis visualized.

Bladder:  Unremarkable

RENAL DUPLEX ULTRASOUND

Right Renal Artery Velocities:

Origin:  Not visualized

Mid:  54 cm/sec

Hilum:  51 cm/sec

Interlobar:  35 cm/sec

Arcuate:  22 cm/sec

Left Renal Artery Velocities:

Origin:  Not visualized

Mid:  57 cm/sec

Hilum:  58 cm/sec

Interlobar:  39 cm/sec

Arcuate:  29 cm/sec

Aortic Velocity:  131 cm/sec

Right Renal-Aortic Ratios:

Origin: Not visualized

Mid:

Hilum:

Interlobar:

Arcuate:

Left Renal-Aortic Ratios:

Origin: Not visualized

Mid:

Hilum:

Interlobar:

Arcuate:

Renal veins are patent.
IMPRESSION: Renal artery origins obscured by shadowing bowel gas. Otherwise no
evidence of significant renal artery stenosis. No significant
sonographic abnormality of the kidneys.

## 2022-05-09 ENCOUNTER — Telehealth: Payer: Self-pay

## 2022-05-09 NOTE — Patient Outreach (Signed)
  Care Coordination   05/09/2022 Name: Wayne Hartman MRN: [id number] DOB: January 23, 1956   Care Coordination Outreach Attempts:  An unsuccessful telephone outreach was attempted today to offer the patient information about available care coordination services as a benefit of their health plan.   Follow Up Plan:  Additional outreach attempts will be made to offer the patient care coordination information and services.   Encounter Outcome:  No Answer   Care Coordination Interventions:  No, not indicated    Tomasa Rand, RN, BSN, [facility] [employer] [phone]

## 2022-05-14 ENCOUNTER — Telehealth: Payer: Self-pay

## 2022-05-14 NOTE — Patient Outreach (Signed)
  Care Coordination   Initial Visit Note   05/14/2022 Name: Wayne Hartman MRN: [id number] DOB: 31-Jul-1955  Wayne Hartman is a 67 y.o. year old male who sees Wayne Hartman for primary care. I spoke with  Wayne Hartman by phone today.  What matters to the patients health and wellness today?  Placed call to patient today to review and offer Wayne Hartman care coordination program.   Patient reports that he is interested in talking.  States that he is at work and can not talk right now.  Patient unable to book appointment due to his job but states that he will call me back when he can talk.  Patient admits that he had a kidney transplant and is doing great.  Reports he follows up at [facility] monthly.     SDOH assessments and interventions completed:  No     Care Coordination Interventions:  No, not indicated   Follow up plan:  will wait for patient to call me back. He is unable to book an appointment.     Encounter Outcome:  Pt. Visit Completed   Wayne Rand, RN, BSN, [facility] [place] Coordinator [phone]
